# Patient Record
Sex: Male | Born: 1955 | Race: White | Hispanic: No | Smoking: Former smoker
Health system: Southern US, Community
[De-identification: ages and names within clinical notes are randomized; demographics above are authoritative.]

## PROBLEM LIST (undated history)

## (undated) DIAGNOSIS — Z9889 Other specified postprocedural states: Secondary | ICD-10-CM

## (undated) DIAGNOSIS — R112 Nausea with vomiting, unspecified: Secondary | ICD-10-CM

## (undated) DIAGNOSIS — M1990 Unspecified osteoarthritis, unspecified site: Secondary | ICD-10-CM

## (undated) HISTORY — PX: OTHER SURGICAL HISTORY: SHX169

## (undated) HISTORY — PX: UMBILICAL HERNIA REPAIR: SHX196

## (undated) HISTORY — PX: SHOULDER SURGERY: SHX246

---

## 1997-06-25 ENCOUNTER — Emergency Department (HOSPITAL_COMMUNITY): Admission: EM | Admit: 1997-06-25 | Discharge: 1997-06-25 | Payer: Self-pay | Admitting: Internal Medicine

## 1998-02-02 ENCOUNTER — Ambulatory Visit (HOSPITAL_BASED_OUTPATIENT_CLINIC_OR_DEPARTMENT_OTHER): Admission: RE | Admit: 1998-02-02 | Discharge: 1998-02-02 | Payer: Self-pay | Admitting: Surgery

## 2000-03-28 ENCOUNTER — Encounter: Admission: RE | Admit: 2000-03-28 | Discharge: 2000-04-09 | Payer: Self-pay | Admitting: General Surgery

## 2006-10-16 ENCOUNTER — Inpatient Hospital Stay (HOSPITAL_COMMUNITY): Admission: RE | Admit: 2006-10-16 | Discharge: 2006-10-18 | Payer: Self-pay | Admitting: Orthopedic Surgery

## 2007-05-04 ENCOUNTER — Emergency Department (HOSPITAL_COMMUNITY): Admission: EM | Admit: 2007-05-04 | Discharge: 2007-05-04 | Payer: Self-pay | Admitting: Emergency Medicine

## 2010-05-30 NOTE — Op Note (Signed)
NAME:  Andre Harvey, Andre Harvey           ACCOUNT NO.:  [id number]   MEDICAL RECORD NO.:  [id number]          PATIENT TYPE:  INP   LOCATION:  2550                         FACILITY:  [facility]   PHYSICIAN:  Dyke Brackett, M.D.    DATE OF BIRTH:  [birth year]   DATE OF PROCEDURE:  10/16/2006  DATE OF DISCHARGE:                               OPERATIVE REPORT   PREOPERATIVE DIAGNOSIS:  Osteoarthritis, left hip.   POSTOPERATIVE DIAGNOSIS:  Osteoarthritis, left hip.   OPERATION:  Left hip replacement (ASR porous-coated AML stem, 15 mm, +8-  mm neck length, with 58-mm acetabulum and 51-mm hip ball).   SURGEON:  Dyke Brackett, M.D.   General anesthesia.   ASSISTANT:  Oris Drone. Petrarca, P.A.-C.   BLOOD LOSS:  Approximately 300.   DESCRIPTION OF PROCEDURE:  Sterile prep and drape.  Lateral positioning.  Posterior approach to the hip made, splitting the iliotibial band, short  external rotators.  Sciatic nerve was carefully protected and the knee  kept flexed as much as possible throughout the procedure.  Short  external rotators were divided.  T capsulotomy was made in the hip.  Hip  was significantly degenerative.  Head was cut about one fingerbreadth  above the lesser trochanter, followed by progressive reaming up to a  14.5-mm diameter to accept a 15-mm stem.  Small statured broach was used  accordingly.  Once the femoral broaching was carried, out the broach was  removed.  Attention was next directed to the acetabulum.  Two wing  retractors were placed with careful protection of the soft tissues to  allow visualization with the cobra retractors additionally.  Acetabulum  was debrided relative to the labrum and soft tissue within it, then  progressively reamed up to 57-mm diameter to accept a 58-mg cup.  Good  bleeding bone was obtained.  The cup was finally inserted after trialing  at about 45 degrees of abduction and 20-30 degrees of anteversion.  The  mono-block ASR cup was used and this was  trialed again with the broach,  appropriate neck length thought to be +4 but once the prosthesis was  inserted, the prosthesis probably was to inserted to slightly more of a  depth, so to speak, in the canal than the trial.  For this reason a +8-  mm neck length was elected, which provided excellent stability, restored  leg lengths and had no tendency to dislocate in any position.  The  wounds were copiously irrigated and closed.  T capsulotomy was closed  with Ethibond, the fascia lata with Ethibond and the subcutaneous  tissues with 2-0 Vicryl, the skin with a skin clip, Marcaine with  epinephrine in the skin, a lightly compressive sterile dressing applied.  Taken to the recovery room in stable condition.     Dyke Brackett, M.D.  Electronically Signed    WDC/MEDQ  D:  10/16/2006  T:  10/16/2006  Job:  [id number]

## 2010-10-10 LAB — POCT URINE HEMOGLOBIN: Hgb urine dipstick: POSITIVE — AB

## 2010-10-26 LAB — CBC
HCT: 34.2 — ABNORMAL LOW
Hemoglobin: 11.4 — ABNORMAL LOW
Hemoglobin: 11.5 — ABNORMAL LOW
MCHC: 33.7
MCV: 83.8
Platelets: 328
RBC: 4.04 — ABNORMAL LOW
RBC: 4.07 — ABNORMAL LOW
RBC: 5.56
RDW: 13.8
RDW: 14.4 — ABNORMAL HIGH
WBC: 10.7 — ABNORMAL HIGH
WBC: 13.7 — ABNORMAL HIGH
WBC: 8.1

## 2010-10-26 LAB — PROTIME-INR
INR: 0.9
Prothrombin Time: 12.1

## 2010-10-26 LAB — COMPREHENSIVE METABOLIC PANEL
Alkaline Phosphatase: 62
BUN: 21
CO2: 24
Calcium: 9.7
Potassium: 4.4

## 2010-10-26 LAB — URINE CULTURE
Colony Count: NO GROWTH
Culture: NO GROWTH

## 2010-10-26 LAB — CROSSMATCH

## 2010-10-26 LAB — URINALYSIS, ROUTINE W REFLEX MICROSCOPIC
Glucose, UA: NEGATIVE
Nitrite: NEGATIVE
Protein, ur: NEGATIVE
Urobilinogen, UA: 0.2

## 2010-10-26 LAB — BASIC METABOLIC PANEL
BUN: 8
BUN: 9
Chloride: 103
Creatinine, Ser: 0.79
GFR calc Af Amer: >60
GFR calc non Af Amer: >60
GFR calc non Af Amer: >60
Potassium: 4.6

## 2010-10-26 LAB — DIFFERENTIAL
Basophils Absolute: 0.1
Basophils Relative: 1
Eosinophils Absolute: 0.3
Eosinophils Relative: 4
Lymphocytes Relative: 23
Lymphs Abs: 1.8
Monocytes Absolute: 0.7
Monocytes Relative: 9
Neutro Abs: 5.1

## 2010-10-26 LAB — ABO/RH: ABO/RH(D): A POS

## 2010-10-26 LAB — APTT: aPTT: 25

## 2012-10-21 ENCOUNTER — Other Ambulatory Visit: Payer: Self-pay | Admitting: Orthopedic Surgery

## 2012-10-21 DIAGNOSIS — M25552 Pain in left hip: Secondary | ICD-10-CM

## 2012-10-26 ENCOUNTER — Ambulatory Visit
Admission: RE | Admit: 2012-10-26 | Discharge: 2012-10-26 | Disposition: A | Discharge status: Home / Self Care | Payer: PRIVATE HEALTH INSURANCE | Source: Ambulatory Visit | Attending: Orthopedic Surgery | Admitting: Orthopedic Surgery

## 2012-10-26 DIAGNOSIS — M25552 Pain in left hip: Secondary | ICD-10-CM

## 2012-11-05 ENCOUNTER — Other Ambulatory Visit: Payer: Self-pay | Admitting: Sports Medicine

## 2012-11-05 DIAGNOSIS — M545 Low back pain: Secondary | ICD-10-CM

## 2012-11-12 ENCOUNTER — Other Ambulatory Visit: Payer: PRIVATE HEALTH INSURANCE

## 2012-11-17 ENCOUNTER — Other Ambulatory Visit: Payer: Self-pay | Admitting: Orthopedic Surgery

## 2012-11-26 ENCOUNTER — Encounter (HOSPITAL_COMMUNITY): Payer: Self-pay | Admitting: Pharmacy Technician

## 2012-11-28 ENCOUNTER — Encounter (HOSPITAL_COMMUNITY)
Admission: RE | Admit: 2012-11-28 | Discharge: 2012-11-28 | Disposition: A | Discharge status: Home / Self Care | Payer: PRIVATE HEALTH INSURANCE | Source: Ambulatory Visit | Attending: Orthopedic Surgery | Admitting: Orthopedic Surgery

## 2012-11-28 ENCOUNTER — Encounter (HOSPITAL_COMMUNITY): Payer: Self-pay

## 2012-11-28 ENCOUNTER — Ambulatory Visit (HOSPITAL_COMMUNITY)
Admission: RE | Admit: 2012-11-28 | Discharge: 2012-11-28 | Disposition: A | Discharge status: Home / Self Care | Payer: PRIVATE HEALTH INSURANCE | Source: Ambulatory Visit | Attending: Orthopedic Surgery | Admitting: Orthopedic Surgery

## 2012-11-28 DIAGNOSIS — M1610 Unilateral primary osteoarthritis, unspecified hip: Secondary | ICD-10-CM | POA: Insufficient documentation

## 2012-11-28 DIAGNOSIS — Z01818 Encounter for other preprocedural examination: Secondary | ICD-10-CM | POA: Insufficient documentation

## 2012-11-28 DIAGNOSIS — F17200 Nicotine dependence, unspecified, uncomplicated: Secondary | ICD-10-CM | POA: Insufficient documentation

## 2012-11-28 DIAGNOSIS — Z01810 Encounter for preprocedural cardiovascular examination: Secondary | ICD-10-CM | POA: Insufficient documentation

## 2012-11-28 DIAGNOSIS — I498 Other specified cardiac arrhythmias: Secondary | ICD-10-CM | POA: Insufficient documentation

## 2012-11-28 DIAGNOSIS — I4510 Unspecified right bundle-branch block: Secondary | ICD-10-CM | POA: Insufficient documentation

## 2012-11-28 DIAGNOSIS — M169 Osteoarthritis of hip, unspecified: Secondary | ICD-10-CM | POA: Insufficient documentation

## 2012-11-28 HISTORY — DX: Other specified postprocedural states: Z98.890

## 2012-11-28 HISTORY — DX: Unspecified osteoarthritis, unspecified site: M19.90

## 2012-11-28 HISTORY — DX: Nausea with vomiting, unspecified: R11.2

## 2012-11-28 LAB — COMPREHENSIVE METABOLIC PANEL
Alkaline Phosphatase: 88 U/L (ref 39–117)
CO2: 25 mEq/L (ref 19–32)
Calcium: 9.5 mg/dL (ref 8.4–10.5)
Creatinine, Ser: 0.78 mg/dL (ref 0.50–1.35)
GFR calc Af Amer: >90 mL/min (ref >90)
GFR calc non Af Amer: >90 mL/min (ref >90)
Glucose, Bld: 88 mg/dL (ref 70–99)
Sodium: 137 mEq/L (ref 135–145)

## 2012-11-28 LAB — URINALYSIS, ROUTINE W REFLEX MICROSCOPIC
Glucose, UA: NEGATIVE mg/dL
Hgb urine dipstick: NEGATIVE
Ketones, ur: NEGATIVE mg/dL
Nitrite: NEGATIVE
Protein, ur: NEGATIVE mg/dL

## 2012-11-28 LAB — CBC WITH DIFFERENTIAL/PLATELET
Basophils Absolute: 0 10*3/uL (ref 0.0–0.1)
Basophils Relative: 1 % (ref 0–1)
HCT: 43.4 % (ref 39.0–52.0)
MCHC: 32.7 g/dL (ref 30.0–36.0)
MCV: 82.4 fL (ref 78.0–100.0)
Monocytes Absolute: 0.8 10*3/uL (ref 0.1–1.0)
Neutro Abs: 4.5 10*3/uL (ref 1.7–7.7)
RDW: 14.3 % (ref 11.5–15.5)

## 2012-11-28 LAB — TYPE AND SCREEN: ABO/RH(D): A POS

## 2012-11-28 LAB — SURGICAL PCR SCREEN: Staphylococcus aureus: NEGATIVE

## 2012-11-28 LAB — APTT: aPTT: 28 seconds (ref 24–37)

## 2012-11-28 NOTE — Pre-Procedure Instructions (Signed)
Andre Harvey  11/28/2012   Your procedure is scheduled on:  November 24  Report to [facility] Entrance "A" [address] at 08:15 AM.  Call this number if you have problems the morning of surgery: [phone]   Remember:   Do not eat food or drink liquids after midnight.   Take these medicines the morning of surgery with A SIP OF WATER: Acyclovir, Oxycodone   STOP Voltaren Monday, November 17   STOP Aspirin, Aleve, Naproxen, Advil, Ibuprofen, Vitamin, Herbs, or Supplements starting today   Do not wear jewelry, make-up or nail polish.  Do not wear lotions, powders, or perfumes. You may wear deodorant.  Do not shave 48 hours prior to surgery. Men may shave face and neck.  Do not bring valuables to the hospital.  [facility] is not responsible                  for any belongings or valuables.               Contacts, dentures or bridgework may not be worn into surgery.  Leave suitcase in the car. After surgery it may be brought to your room.  For patients admitted to the hospital, discharge time is determined by your                treatment team.               Special Instructions: Shower using CHG 2 nights before surgery and the night before surgery.  If you shower the day of surgery use CHG.  Use special wash - you have one bottle of CHG for all showers.  You should use approximately 1/3 of the bottle for each shower.   Please read over the following fact sheets that you were given: Pain Booklet, Coughing and Deep Breathing, Blood Transfusion Information, Total Joint Packet and Surgical Site Infection Prevention

## 2012-12-01 NOTE — Progress Notes (Signed)
Anesthesia Chart Review:  Patient is a 57 year old male scheduled for left hip revision with removal of ball and cup on 12/08/12 by Dr. Turner Daniels.  History includes smoker, post-operative N/V, osteoarthritis, UHR, shoulder surgery, left THA '08. No PCP is specified.  Preoperative CXR and labs noted.  EKG on 11/28/12 showed SB @ 55 bpm, LAD, incomplete right BBB.  Currently, there are no comparison EKGs.  No CV symptoms were documented at his PAT appointment.  He has no documented history of HTN, CAD/MI, CHF, or DM.  He will be evaluated by his assigned anesthesiologist on the day of surgery, but if he remains asymptomatic from a CV standpoint then I would anticipate that he could proceed as planned.  Velna [facility]/Anesthesiology Phone [phone] 12/01/2012 3:54 PM

## 2012-12-05 NOTE — H&P (Signed)
TOTAL HIP REVISION ADMISSION H&P  Patient is admitted for left revision total hip arthroplasty.  Subjective:  Chief Complaint: left hip pain  HPI: Andre Harvey, 57 y.o. male, has a history of pain and functional disability in the left hip due to arthritis and patient has failed non-surgical conservative treatments for greater than 12 weeks to include NSAID's and/or analgesics, flexibility and strengthening excercises and activity modification. The indications for the revision total hip arthroplasty are bearing surface wear leading to  symptomatic synovitis.  Onset of symptoms was gradual starting 6 years ago with gradually worsening course since that time.  Prior procedures on the left hip include arthroplasty.  Patient currently rates pain in the left hip at 10 out of 10 with activity.  There is night pain, worsening of pain with activity and weight bearing, pain that interfers with activities of daily living and joint swelling.MRI: Patient's MRI does show a fluid collection over the lateral greater trochanter region at 1.6 x 1.9 x 1.6 cm.  Patient also has severe degenerative arthropathy of the right hip. This condition presents safety issues increasing the risk of falls.   There is no current active infection.  There are no active problems to display for this patient.  Past Medical History  Diagnosis Date  . PONV (postoperative nausea and vomiting)     x 1  . Osteoarthritis     Past Surgical History  Procedure Laterality Date  . Hip arthroplasty Left   . Shoulder surgery      left and right  . Umbilical hernia repair      No prescriptions prior to admission   Allergies  Allergen Reactions  . Codeine Nausea And Vomiting    History  Substance Use Topics  . Smoking status: Light Tobacco Smoker    Types: Cigars  . Smokeless tobacco: Not on file     Comment: reports box last 3- 4 months  . Alcohol Use: Yes     Comment: reports drinks about every 2- 3 months reports used to  be heavy    No family history on file.    Review of Systems  Constitutional: Negative.   HENT: Negative.   Eyes: Negative.   Respiratory: Negative.   Cardiovascular: Negative.   Gastrointestinal: Negative.   Genitourinary: Negative.   Musculoskeletal: Positive for joint pain.  Skin: Negative.   Neurological: Negative.   Endo/Heme/Allergies: Negative.   Psychiatric/Behavioral: Negative.     Objective:  Physical Exam  Constitutional: He is oriented to person, place, and time. He appears well-developed and well-nourished.  HENT:  Head: Normocephalic and atraumatic.  Eyes: Pupils are equal, round, and reactive to light.  Neck: Normal range of motion. Neck supple.  Cardiovascular: Intact distal pulses.   Respiratory: Effort normal.  Musculoskeletal:  Patient's skin is intact.  His respirations are regular and nonlabored.  Patient's left hip has mild pain with hip flexion extension internal/external rotation or log roll.  Patient's right hip also has pain with these motions.  He does have pain with internal and external rotation has difficulty with range of motion.    Neurological: He is alert and oriented to person, place, and time.  Skin: Skin is warm and dry.  Psychiatric: He has a normal mood and affect. His behavior is normal. Judgment and thought content normal.    Vital signs in last 24 hours:     Labs:   There is no height or weight on file to calculate BMI.  Imaging Review:  MRI: Patient's MRI does show a fluid collection over the lateral greater trochanter region at 1.6 x 1.9 x 1.6 cm.  Patient also has severe degenerative arthropathy of the right hip.  Assessment/Plan:  Assess: Status post left ASR hip in 2008 by Dr. Madelon Lips with fluid collection and pain  The patient history, physical examination, clinical judgement of the provider and imaging studies are consistent with end stage degenerative joint disease of the left hip(s), previous total hip  arthroplasty. Revision total hip arthroplasty is deemed medically necessary. The treatment options including medical management, injection therapy, arthroscopy and arthroplasty were discussed at length. The risks and benefits of total hip arthroplasty were presented and reviewed. The risks due to aseptic loosening, infection, stiffness, dislocation/subluxation,  thromboembolic complications and other imponderables were discussed.  The patient acknowledged the explanation, agreed to proceed with the plan and consent was signed. Patient is being admitted for inpatient treatment for surgery, pain control, PT, OT, prophylactic antibiotics, VTE prophylaxis, progressive ambulation and ADL's and discharge planning. The patient is planning to be discharged home with home health services

## 2012-12-07 MED ORDER — CEFAZOLIN SODIUM-DEXTROSE 2-3 GM-% IV SOLR
2.0000 g | INTRAVENOUS | Status: AC
  Administered 2012-12-08: 2 g via INTRAVENOUS

## 2012-12-08 ENCOUNTER — Encounter (HOSPITAL_COMMUNITY): Payer: Worker's Compensation | Admitting: Vascular Surgery

## 2012-12-08 ENCOUNTER — Inpatient Hospital Stay (HOSPITAL_COMMUNITY): Payer: Worker's Compensation | Admitting: Certified Registered"

## 2012-12-08 ENCOUNTER — Inpatient Hospital Stay (HOSPITAL_COMMUNITY): Payer: Worker's Compensation

## 2012-12-08 ENCOUNTER — Encounter (HOSPITAL_COMMUNITY): Payer: Self-pay | Admitting: *Deleted

## 2012-12-08 ENCOUNTER — Encounter (HOSPITAL_COMMUNITY)
Admission: RE | Disposition: A | Discharge status: Home Health | Payer: Self-pay | Source: Ambulatory Visit | Attending: Orthopedic Surgery

## 2012-12-08 ENCOUNTER — Inpatient Hospital Stay (HOSPITAL_COMMUNITY)
Admission: RE | Admit: 2012-12-08 | Discharge: 2012-12-10 | DRG: 468 | Disposition: A | Discharge status: Home Health | Payer: Worker's Compensation | Source: Ambulatory Visit | Attending: Orthopedic Surgery | Admitting: Orthopedic Surgery

## 2012-12-08 DIAGNOSIS — F17200 Nicotine dependence, unspecified, uncomplicated: Secondary | ICD-10-CM | POA: Diagnosis present

## 2012-12-08 DIAGNOSIS — M6580 Other synovitis and tenosynovitis, unspecified site: Secondary | ICD-10-CM | POA: Diagnosis present

## 2012-12-08 DIAGNOSIS — T8489XA Other specified complication of internal orthopedic prosthetic devices, implants and grafts, initial encounter: Principal | ICD-10-CM | POA: Diagnosis present

## 2012-12-08 DIAGNOSIS — T8484XA Pain due to internal orthopedic prosthetic devices, implants and grafts, initial encounter: Secondary | ICD-10-CM

## 2012-12-08 DIAGNOSIS — Z96649 Presence of unspecified artificial hip joint: Secondary | ICD-10-CM

## 2012-12-08 HISTORY — PX: TOTAL HIP REVISION: SHX763

## 2012-12-08 SURGERY — TOTAL HIP REVISION
Anesthesia: General | Site: Hip | Laterality: Left | Wound class: Clean

## 2012-12-08 MED ORDER — METHOCARBAMOL 500 MG PO TABS: 500.0000 mg | ORAL_TABLET | Freq: Two times a day (BID) | ORAL | Status: DC

## 2012-12-08 MED ORDER — DEXAMETHASONE SODIUM PHOSPHATE 4 MG/ML IJ SOLN
INTRAMUSCULAR | Status: DC | PRN
  Administered 2012-12-08: 4 mg via INTRAVENOUS

## 2012-12-08 MED ORDER — ONDANSETRON HCL 4 MG/2ML IJ SOLN
INTRAMUSCULAR | Status: DC | PRN
  Administered 2012-12-08: 4 mg via INTRAVENOUS

## 2012-12-08 MED ORDER — FENTANYL CITRATE 0.05 MG/ML IJ SOLN
INTRAMUSCULAR | Status: DC | PRN
  Administered 2012-12-08: 100 ug via INTRAVENOUS
  Administered 2012-12-08 (×3): 50 ug via INTRAVENOUS

## 2012-12-08 MED ORDER — MAGNESIUM CITRATE PO SOLN: 1.0000 | Freq: Once | ORAL | Status: AC | PRN

## 2012-12-08 MED ORDER — HYDROMORPHONE HCL PF 1 MG/ML IJ SOLN
0.2500 mg | INTRAMUSCULAR | Status: DC | PRN
  Administered 2012-12-08 (×2): 0.5 mg via INTRAVENOUS

## 2012-12-08 MED ORDER — METHOCARBAMOL 500 MG PO TABS
ORAL_TABLET | ORAL | Status: AC
  Filled 2012-12-08: qty 1

## 2012-12-08 MED ORDER — KCL IN DEXTROSE-NACL 20-5-0.45 MEQ/L-%-% IV SOLN
INTRAVENOUS | Status: DC
  Administered 2012-12-09: 09:00:00 via INTRAVENOUS
  Filled 2012-12-08 (×7): qty 1000

## 2012-12-08 MED ORDER — METOCLOPRAMIDE HCL 5 MG/ML IJ SOLN: 10.0000 mg | Freq: Once | INTRAMUSCULAR | Status: DC | PRN

## 2012-12-08 MED ORDER — NEOSTIGMINE METHYLSULFATE 1 MG/ML IJ SOLN
INTRAMUSCULAR | Status: DC | PRN
  Administered 2012-12-08: 4 mg via INTRAVENOUS

## 2012-12-08 MED ORDER — SENNOSIDES-DOCUSATE SODIUM 8.6-50 MG PO TABS: 1.0000 | ORAL_TABLET | Freq: Every evening | ORAL | Status: DC | PRN

## 2012-12-08 MED ORDER — ACETAMINOPHEN 325 MG PO TABS: 650.0000 mg | ORAL_TABLET | Freq: Four times a day (QID) | ORAL | Status: DC | PRN

## 2012-12-08 MED ORDER — DIPHENHYDRAMINE HCL 12.5 MG/5ML PO ELIX: 12.5000 mg | ORAL_SOLUTION | ORAL | Status: DC | PRN

## 2012-12-08 MED ORDER — GLYCOPYRROLATE 0.2 MG/ML IJ SOLN
INTRAMUSCULAR | Status: DC | PRN
  Administered 2012-12-08: 0.2 mg via INTRAVENOUS
  Administered 2012-12-08: .6 mg via INTRAVENOUS

## 2012-12-08 MED ORDER — OXYCODONE HCL 5 MG PO TABS
5.0000 mg | ORAL_TABLET | Freq: Once | ORAL | Status: AC | PRN
  Administered 2012-12-08: 5 mg via ORAL

## 2012-12-08 MED ORDER — PHENOL 1.4 % MT LIQD: 1.0000 | OROMUCOSAL | Status: DC | PRN

## 2012-12-08 MED ORDER — LACTATED RINGERS IV SOLN
INTRAVENOUS | Status: DC
  Administered 2012-12-08 (×2): via INTRAVENOUS

## 2012-12-08 MED ORDER — MIDAZOLAM HCL 5 MG/5ML IJ SOLN
INTRAMUSCULAR | Status: DC | PRN
  Administered 2012-12-08: 2 mg via INTRAVENOUS

## 2012-12-08 MED ORDER — METOCLOPRAMIDE HCL 5 MG/ML IJ SOLN
INTRAMUSCULAR | Status: DC | PRN
  Administered 2012-12-08: 10 mg via INTRAVENOUS

## 2012-12-08 MED ORDER — VECURONIUM BROMIDE 10 MG IV SOLR
INTRAVENOUS | Status: DC | PRN
  Administered 2012-12-08: 2 mg via INTRAVENOUS
  Administered 2012-12-08: 3 mg via INTRAVENOUS
  Administered 2012-12-08: 2 mg via INTRAVENOUS

## 2012-12-08 MED ORDER — HYDROMORPHONE HCL PF 1 MG/ML IJ SOLN
INTRAMUSCULAR | Status: AC
  Administered 2012-12-08: 0.5 mg via INTRAVENOUS
  Filled 2012-12-08: qty 1

## 2012-12-08 MED ORDER — METOCLOPRAMIDE HCL 5 MG/ML IJ SOLN: 5.0000 mg | Freq: Three times a day (TID) | INTRAMUSCULAR | Status: DC | PRN

## 2012-12-08 MED ORDER — OXYCODONE HCL 5 MG/5ML PO SOLN
5.0000 mg | Freq: Once | ORAL | Status: AC | PRN
Start: 2012-12-08 — End: 2012-12-08

## 2012-12-08 MED ORDER — HYDROMORPHONE HCL PF 1 MG/ML IJ SOLN
0.2500 mg | INTRAMUSCULAR | Status: DC | PRN
  Administered 2012-12-08 (×5): 0.5 mg via INTRAVENOUS

## 2012-12-08 MED ORDER — ONDANSETRON HCL 4 MG/2ML IJ SOLN: 4.0000 mg | Freq: Four times a day (QID) | INTRAMUSCULAR | Status: DC | PRN

## 2012-12-08 MED ORDER — SODIUM CHLORIDE 0.9 % IR SOLN
Status: DC | PRN
  Administered 2012-12-08: 1000 mL

## 2012-12-08 MED ORDER — MENTHOL 3 MG MT LOZG: 1.0000 | LOZENGE | OROMUCOSAL | Status: DC | PRN

## 2012-12-08 MED ORDER — ASPIRIN EC 325 MG PO TBEC
325.0000 mg | DELAYED_RELEASE_TABLET | Freq: Every day | ORAL | Status: DC
  Administered 2012-12-09: 325 mg via ORAL
  Filled 2012-12-08 (×3): qty 1

## 2012-12-08 MED ORDER — OXYCODONE-ACETAMINOPHEN 10-325 MG PO TABS: 1.0000 | ORAL_TABLET | Freq: Four times a day (QID) | ORAL | Status: DC | PRN

## 2012-12-08 MED ORDER — CEFAZOLIN SODIUM-DEXTROSE 2-3 GM-% IV SOLR
INTRAVENOUS | Status: AC
  Filled 2012-12-08: qty 50

## 2012-12-08 MED ORDER — ARTIFICIAL TEARS OP OINT
TOPICAL_OINTMENT | OPHTHALMIC | Status: DC | PRN
  Administered 2012-12-08: 1 via OPHTHALMIC

## 2012-12-08 MED ORDER — OXYCODONE HCL 5 MG PO TABS
ORAL_TABLET | ORAL | Status: AC
  Filled 2012-12-08: qty 2

## 2012-12-08 MED ORDER — LIDOCAINE HCL (CARDIAC) 20 MG/ML IV SOLN
INTRAVENOUS | Status: DC | PRN
  Administered 2012-12-08: 100 mg via INTRAVENOUS

## 2012-12-08 MED ORDER — PROPOFOL 10 MG/ML IV BOLUS
INTRAVENOUS | Status: DC | PRN
  Administered 2012-12-08: 200 mg via INTRAVENOUS

## 2012-12-08 MED ORDER — ASPIRIN EC 325 MG PO TBEC: 325.0000 mg | DELAYED_RELEASE_TABLET | Freq: Two times a day (BID) | ORAL | Status: DC

## 2012-12-08 MED ORDER — METHOCARBAMOL 100 MG/ML IJ SOLN
500.0000 mg | Freq: Four times a day (QID) | INTRAVENOUS | Status: DC | PRN
  Filled 2012-12-08: qty 5

## 2012-12-08 MED ORDER — ROCURONIUM BROMIDE 100 MG/10ML IV SOLN
INTRAVENOUS | Status: DC | PRN
  Administered 2012-12-08: 50 mg via INTRAVENOUS

## 2012-12-08 MED ORDER — HYDROMORPHONE HCL PF 1 MG/ML IJ SOLN
INTRAMUSCULAR | Status: AC
  Filled 2012-12-08: qty 1

## 2012-12-08 MED ORDER — ONDANSETRON HCL 4 MG PO TABS: 4.0000 mg | ORAL_TABLET | Freq: Four times a day (QID) | ORAL | Status: DC | PRN

## 2012-12-08 MED ORDER — KCL IN DEXTROSE-NACL 20-5-0.45 MEQ/L-%-% IV SOLN
INTRAVENOUS | Status: AC
  Filled 2012-12-08: qty 1000

## 2012-12-08 MED ORDER — METOCLOPRAMIDE HCL 10 MG PO TABS
5.0000 mg | ORAL_TABLET | Freq: Three times a day (TID) | ORAL | Status: DC | PRN
  Administered 2012-12-10: 10 mg via ORAL
  Filled 2012-12-08: qty 1

## 2012-12-08 MED ORDER — ACETAMINOPHEN 650 MG RE SUPP: 650.0000 mg | Freq: Four times a day (QID) | RECTAL | Status: DC | PRN

## 2012-12-08 MED ORDER — HYDROMORPHONE HCL PF 1 MG/ML IJ SOLN
INTRAMUSCULAR | Status: AC
  Filled 2012-12-08: qty 2

## 2012-12-08 MED ORDER — METHOCARBAMOL 500 MG PO TABS
500.0000 mg | ORAL_TABLET | Freq: Four times a day (QID) | ORAL | Status: DC | PRN
  Administered 2012-12-08 – 2012-12-10 (×6): 500 mg via ORAL
  Filled 2012-12-08 (×6): qty 1

## 2012-12-08 MED ORDER — OXYCODONE HCL 5 MG PO TABS
5.0000 mg | ORAL_TABLET | ORAL | Status: DC | PRN
  Administered 2012-12-08 – 2012-12-10 (×10): 10 mg via ORAL
  Filled 2012-12-08 (×8): qty 2

## 2012-12-08 MED ORDER — HYDROMORPHONE HCL PF 1 MG/ML IJ SOLN
1.0000 mg | INTRAMUSCULAR | Status: DC | PRN
  Administered 2012-12-09: 1 mg via INTRAVENOUS
  Filled 2012-12-08: qty 1

## 2012-12-08 MED ORDER — DOCUSATE SODIUM 100 MG PO CAPS
100.0000 mg | ORAL_CAPSULE | Freq: Two times a day (BID) | ORAL | Status: DC
  Administered 2012-12-08 – 2012-12-09 (×3): 100 mg via ORAL
  Filled 2012-12-08 (×5): qty 1

## 2012-12-08 MED ORDER — BISACODYL 5 MG PO TBEC: 5.0000 mg | DELAYED_RELEASE_TABLET | Freq: Every day | ORAL | Status: DC | PRN

## 2012-12-08 MED ORDER — OXYCODONE HCL 5 MG PO TABS
ORAL_TABLET | ORAL | Status: AC
  Filled 2012-12-08: qty 3

## 2012-12-08 MED ORDER — BUPIVACAINE-EPINEPHRINE 0.5% -1:200000 IJ SOLN
INTRAMUSCULAR | Status: DC | PRN
  Administered 2012-12-08: 20 mL

## 2012-12-08 SURGICAL SUPPLY — 68 items
BOWL SMART MIX CTS (DISPOSABLE) IMPLANT
BRUSH FEMORAL CANAL (MISCELLANEOUS) IMPLANT
CLOTH BEACON ORANGE TIMEOUT ST (SAFETY) ×2 IMPLANT
COVER SURGICAL LIGHT HANDLE (MISCELLANEOUS) ×2 IMPLANT
CUP ACET PINNACLE SECTR 60MM (Hips) IMPLANT
DRAPE C-ARM 42X72 X-RAY (DRAPES) IMPLANT
DRAPE ORTHO SPLIT 77X108 STRL (DRAPES) ×2
DRAPE PROXIMA HALF (DRAPES) ×2 IMPLANT
DRAPE SURG ORHT 6 SPLT 77X108 (DRAPES) ×1 IMPLANT
DRAPE U-SHAPE 47X51 STRL (DRAPES) ×2 IMPLANT
DRILL BIT 7/64X5 (BIT) ×2 IMPLANT
DRSG AQUACEL AG ADV 3.5X10 (GAUZE/BANDAGES/DRESSINGS) ×1 IMPLANT
DRSG MEPILEX BORDER 4X8 (GAUZE/BANDAGES/DRESSINGS) ×2 IMPLANT
DURAPREP 26ML APPLICATOR (WOUND CARE) ×2 IMPLANT
ELECT BLADE 4.0 EZ CLEAN MEGAD (MISCELLANEOUS)
ELECT BLADE 6.5 EXT (BLADE) IMPLANT
ELECT REM PT RETURN 9FT ADLT (ELECTROSURGICAL) ×2
ELECTRODE BLDE 4.0 EZ CLN MEGD (MISCELLANEOUS) IMPLANT
ELECTRODE REM PT RTRN 9FT ADLT (ELECTROSURGICAL) ×1 IMPLANT
EVACUATOR 1/8 PVC DRAIN (DRAIN) IMPLANT
GAUZE XEROFORM 5X9 LF (GAUZE/BANDAGES/DRESSINGS) ×2 IMPLANT
GLOVE BIO SURGEON STRL SZ7.5 (GLOVE) ×2 IMPLANT
GLOVE BIO SURGEON STRL SZ8.5 (GLOVE) ×4 IMPLANT
GLOVE BIOGEL PI IND STRL 8 (GLOVE) ×2 IMPLANT
GLOVE BIOGEL PI IND STRL 9 (GLOVE) ×1 IMPLANT
GLOVE BIOGEL PI INDICATOR 8 (GLOVE) ×2
GLOVE BIOGEL PI INDICATOR 9 (GLOVE) ×1
GOWN PREVENTION PLUS XLARGE (GOWN DISPOSABLE) ×6 IMPLANT
GOWN STRL NON-REIN LRG LVL3 (GOWN DISPOSABLE) ×4 IMPLANT
GOWN STRL REIN XL XLG (GOWN DISPOSABLE) ×4 IMPLANT
HANDPIECE INTERPULSE COAX TIP (DISPOSABLE)
HEAD CER BIO DELTA 36 PLUS 1.5 (Hips) ×1 IMPLANT
HEEL PROTECTOR  874200 (MISCELLANEOUS)
HEEL PROTECTOR 874200 (MISCELLANEOUS) IMPLANT
HOOD PEEL AWAY FACE SHEILD DIS (HOOD) ×4 IMPLANT
KIT BASIN OR (CUSTOM PROCEDURE TRAY) ×2 IMPLANT
KIT ROOM TURNOVER OR (KITS) ×2 IMPLANT
LINER MARATHON 10D 36M 58+10 (Hips) IMPLANT
LINER MARATHON 10DEG 36M 58+10 (Hips) ×2 IMPLANT
MANIFOLD NEPTUNE II (INSTRUMENTS) ×2 IMPLANT
NEEDLE 22X1 1/2 (OR ONLY) (NEEDLE) ×2 IMPLANT
NOZZLE PRISM 8.5MM (MISCELLANEOUS) IMPLANT
NS IRRIG 1000ML POUR BTL (IV SOLUTION) ×4 IMPLANT
PACK TOTAL JOINT (CUSTOM PROCEDURE TRAY) ×2 IMPLANT
PAD ARMBOARD 7.5X6 YLW CONV (MISCELLANEOUS) ×4 IMPLANT
PASSER SUT SWANSON 36MM LOOP (INSTRUMENTS) IMPLANT
PINNSECTOR W/GRIP ACE CUP 60MM (Hips) ×2 IMPLANT
PRESSURIZER FEMORAL UNIV (MISCELLANEOUS) IMPLANT
SCREW PINN CAN BONE 6.5X50MM (Screw) ×1 IMPLANT
SET HNDPC FAN SPRY TIP SCT (DISPOSABLE) IMPLANT
SLEEVE SURGEON STRL (DRAPES) IMPLANT
SPONGE GAUZE 4X4 12PLY (GAUZE/BANDAGES/DRESSINGS) ×2 IMPLANT
SPONGE LAP 18X18 X RAY DECT (DISPOSABLE) IMPLANT
STAPLER VISISTAT 35W (STAPLE) ×2 IMPLANT
SUT ETHIBOND 2 V 37 (SUTURE) ×2 IMPLANT
SUT ETHILON 3 0 FSL (SUTURE) ×2 IMPLANT
SUT VIC AB 0 CTB1 27 (SUTURE) ×2 IMPLANT
SUT VIC AB 1 CTX 36 (SUTURE) ×2
SUT VIC AB 1 CTX36XBRD ANBCTR (SUTURE) ×1 IMPLANT
SUT VIC AB 2-0 CTB1 (SUTURE) ×2 IMPLANT
SYR 20ML ECCENTRIC (SYRINGE) ×2 IMPLANT
SYR CONTROL 10ML LL (SYRINGE) ×2 IMPLANT
TOWEL OR 17X24 6PK STRL BLUE (TOWEL DISPOSABLE) ×2 IMPLANT
TOWEL OR 17X26 10 PK STRL BLUE (TOWEL DISPOSABLE) ×2 IMPLANT
TOWER CARTRIDGE SMART MIX (DISPOSABLE) IMPLANT
TRAY FOLEY CATH 14FR (SET/KITS/TRAYS/PACK) IMPLANT
TUBE ANAEROBIC SPECIMEN COL (MISCELLANEOUS) ×2 IMPLANT
WATER STERILE IRR 1000ML POUR (IV SOLUTION) ×6 IMPLANT

## 2012-12-08 NOTE — Interval H&P Note (Signed)
History and Physical Interval Note:  12/08/2012 9:05 AM  Andre Harvey  has presented today for surgery, with the diagnosis of LEFT HIP PAIN WITH PAINFUL RETAINED ASR HARDWARE  The various methods of treatment have been discussed with the patient and family. After consideration of risks, benefits and other options for treatment, the patient has consented to  Procedure(s): TOTAL HIP REVISION WITH HARDWARE REMOVAL ( BALL& CUP) (Left) as a surgical intervention .  The patient's history has been reviewed, patient examined, no change in status, stable for surgery.  I have reviewed the patient's chart and labs.  Questions were answered to the patient's satisfaction.     [name]

## 2012-12-08 NOTE — Anesthesia Preprocedure Evaluation (Addendum)
Anesthesia Evaluation  Patient identified by MRN, date of birth, ID band Patient awake    Reviewed: Allergy & Precautions, H&P , NPO status , Patient's Chart, lab work & pertinent test results, reviewed documented beta blocker date and time   History of Anesthesia Complications (+) PONV and history of anesthetic complications  Airway Mallampati: II TM Distance: >3 FB Neck ROM: full    Dental  (+) Teeth Intact and Dental Advisory Given   Pulmonary Current Smoker,  breath sounds clear to auscultation        Cardiovascular negative cardio ROS  Rhythm:regular Rate:Normal     Neuro/Psych negative neurological ROS  negative psych ROS   GI/Hepatic negative GI ROS, Neg liver ROS,   Endo/Other  negative endocrine ROS  Renal/GU negative Renal ROS  negative genitourinary   Musculoskeletal   Abdominal   Peds  Hematology negative hematology ROS (+)   Anesthesia Other Findings See surgeon's H&P   Reproductive/Obstetrics negative OB ROS                          Anesthesia Physical Anesthesia Plan  ASA: II  Anesthesia Plan: General   Post-op Pain Management:    Induction: Intravenous  Airway Management Planned: Oral ETT  Additional Equipment:   Intra-op Plan:   Post-operative Plan: Extubation in OR  Informed Consent: I have reviewed the patients History and Physical, chart, labs and discussed the procedure including the risks, benefits and alternatives for the proposed anesthesia with the patient or authorized representative who has indicated his/her understanding and acceptance.   Dental Advisory Given  Plan Discussed with: CRNA and Surgeon  Anesthesia Plan Comments:         Anesthesia Quick Evaluation

## 2012-12-08 NOTE — Anesthesia Postprocedure Evaluation (Signed)
Anesthesia Post Note  Patient: Andre Harvey  Procedure(s) Performed: Procedure(s) (LRB): TOTAL HIP REVISION WITH HARDWARE REMOVAL ( BALL& CUP) (Left)  Anesthesia type: General  Patient location: PACU  Post pain: Pain level controlled  Post assessment: Patient's Cardiovascular Status Stable  Last Vitals:  Filed Vitals:   12/08/12 1315  BP: 157/76  Pulse: 57  Temp:   Resp: 13    Post vital signs: Reviewed and stable  Level of consciousness: alert  Complications: No apparent anesthesia complications

## 2012-12-08 NOTE — Transfer of Care (Signed)
Immediate Anesthesia Transfer of Care Note  Patient: Andre Harvey  Procedure(s) Performed: Procedure(s): TOTAL HIP REVISION WITH HARDWARE REMOVAL ( BALL& CUP) (Left)  Patient Location: PACU  Anesthesia Type:General  Level of Consciousness: awake, alert  and oriented  Airway & Oxygen Therapy: Patient Spontanous Breathing and Patient connected to nasal cannula oxygen  Post-op Assessment: Report given to PACU RN and Patient moving all extremities X 4  Post vital signs: Reviewed and stable  Complications: No apparent anesthesia complications

## 2012-12-08 NOTE — Op Note (Addendum)
Preop diagnosis: Painful Left Depuy ASR on AML total hip  Postoperative diagnosis: Same  Procedure: Revision left total hip arthroplasty with removal of ASR cup and femoral head and revision to a 60 mm Gryption cup 10 polyethylene liner index posterior and a +1.5 36 mm ceramic head.  Surgeon: Feliberto Gottron. Turner Daniels M.D.  Assistant: Tomi Likens. Gaylene Brooks  (present throughout entire procedure and necessary for timely completion of the procedure)  Estimated blood loss: 400 cc  Fluid replacement: 1800 cc of crystalloid  Complications: None  Indications: Patient with a left ASR on S-ROM total hip that did very well until a few months ago when he had increasing groin pain. The pain wakes him up at night and recently got to the point where he could no longer go walking on a regular basis. MRI scan showed fluid collection, but no bony destruction. Plain x-rays show no change in the position of the components and the stem appears to be well ingrown. Risks and benefits of revision surgery have been discussed and questions answered.  Procedure: Patient was identified by arm band receive preoperative IV antibiotics in the holding area at, and hospital. He was then taken to the operating room where the appropriate anesthetic monitors were attached and general endotracheal anesthesia induced with the patient in the supine position. He was then rolled into the R lateral decubitus position and fixed there with a mark 2 pelvic clamp. A Foley catheter was inserted and the limb prepped and draped in usual sterile fashion from the ankle to the hemipelvis. Time out procedure performed. Skin along the lateral hip and thigh infiltrated with 10 cc of 1/2% Marcaine and epinephrine solution. We began the operation by recreating the old posterior lateral incision 15 cm in line through the skin and subcutaneous tissue down to the level of the IT band which was cut in line with the skin incision. The knee the IT band we encountered  clear fluid with a reactive sac that was excised.. We then remove scar tissue from around to the ASR cup and femoral stem trunnion, dislocated a total hip and removed the ASR head with a mallet and metal cylinder. The trunnion was then tucked anterior and superior to the acetabulum and we continued to remove scar tissue from around the acetabular component. A posterior inferior wing retractor was hammered into place. And we began loosening the cup by placing a 1/4 inch osteotome between the edge of the acetabular component and the bone. We then used the short Innomed curved osteotome around the edge of the cup and at that point it came out relatively easily indicating no and growth. Very little bone was noticed on the ingrowth surface of the cup and fibrous tissue is then stripped from the acetabulum. We reamed up to a 59 mm basket reamer obtaining good coverage in all quadrants irrigated with normal saline solution. We then hammered into place a 60 mm Gryption cup in 45 of abduction and 25 of anteversion. A central occluder was screwed into place followed by a 10 polyethylene liner index posterior and superior. A trial reduction was then performed with a +0 36 mm femoral head instability was noted to 90 of flexion 70 of internal rotation and in full extension the hip could not be dislocated with external rotation. At this point a real +1.5 36 mm ceramic head was hammered into place, the hip reduced and irrigated with normal saline solution. The capsular flap was repaired back to the intertrochanteric crest through  drill holes with #2 Ethibond suture. We then closed the IT band with running #1 Vicryl suture, the subcutaneous tissue with 0 and 2-0 undyed Vicryl suture, the skin was closed with running interlocking 3-0 nylon suture. A dressing of Mepilex was then applied, the patient was unclamped a rolled supine awakened extubated and taken to the recovery without difficulty.

## 2012-12-08 NOTE — Preoperative (Signed)
Beta Blockers   Reason not to administer Beta Blockers:Not Applicable 

## 2012-12-09 ENCOUNTER — Encounter (HOSPITAL_COMMUNITY): Payer: Self-pay | Admitting: General Practice

## 2012-12-09 LAB — BASIC METABOLIC PANEL
CO2: 26 mEq/L (ref 19–32)
Calcium: 8.9 mg/dL (ref 8.4–10.5)
Chloride: 99 mEq/L (ref 96–112)
GFR calc Af Amer: >90 mL/min (ref >90)
Glucose, Bld: 108 mg/dL — ABNORMAL HIGH (ref 70–99)
Potassium: 3.7 mEq/L (ref 3.5–5.1)
Sodium: 137 mEq/L (ref 135–145)

## 2012-12-09 LAB — CBC
Hemoglobin: 12 g/dL — ABNORMAL LOW (ref 13.0–17.0)
MCV: 81.6 fL (ref 78.0–100.0)
Platelets: 295 10*3/uL (ref 150–400)
RBC: 4.3 MIL/uL (ref 4.22–5.81)
RDW: 14.4 % (ref 11.5–15.5)
WBC: 11.1 10*3/uL — ABNORMAL HIGH (ref 4.0–10.5)

## 2012-12-09 NOTE — Progress Notes (Signed)
  PT Progress Note   12/09/12 1539  PT Visit Information  Last PT Received On 12/09/12  Assistance Needed +2  History of Present Illness Revision right total hip arthroplasty with removal of ASR cup and femoral head and revision   PT Time Calculation  PT Start Time 1435  PT Stop Time 1507  PT Time Calculation (min) 32 min  Subjective Data  Patient Stated Goal To return home  Precautions  Precautions Posterior Hip;Fall  Precaution Booklet Issued Yes (comment)  Precaution Comments Pt able to recall 1/3 post hip precautions  Restrictions  Weight Bearing Restrictions Yes  LLE Weight Bearing WBAT  Cognition  Arousal/Alertness Awake/alert  Behavior During Therapy WFL for tasks assessed/performed  Overall Cognitive Status Within Functional Limits for tasks assessed  Bed Mobility  Bed Mobility Not assessed  Details for Bed Mobility Assistance Pt received up in recliner  Transfers  Transfers Sit to Stand;Stand to Sit  Sit to Stand 1: +2 Total assist;With upper extremity assist;From chair/3-in-1  Sit to Stand: Patient Percentage 50%  Stand to Sit 1: +2 Total assist;With upper extremity assist;With armrests;To chair/3-in-1  Stand to Sit: Patient Percentage 50%  Details for Transfer Assistance VC's for hand placement on seated surface prior to initiating transfers.  Ambulation/Gait  Ambulation/Gait Assistance 4: Min guard  Ambulation Distance (Feet) 65 Feet  Assistive device Rolling walker  Ambulation/Gait Assistance Details VC's for sequencing and safety awareness with the RW. Pt was cued for increased heel strike, and encouraged step-through gait pattern  Gait Pattern Step-to pattern;Step-through pattern;Decreased stride length;Left flexed knee in stance  Gait velocity Decreased  Stairs No  Exercises  Exercises General Lower Extremity  General Exercises - Lower Extremity  Ankle Circles/Pumps 10 reps  Quad Sets 10 reps  Short Arc Quad 10 reps  Heel Slides 10 reps  Hip  ABduction/ADduction 10 reps  PT - End of Session  Equipment Utilized During Treatment Gait belt  Activity Tolerance Patient tolerated treatment well  Patient left in chair;with call bell/phone within reach  Nurse Communication Mobility status  PT - Assessment/Plan  PT Plan Current plan remains appropriate  PT Frequency 7X/week  Follow Up Recommendations Home health PT  PT equipment Rolling walker with 5" wheels;3in1 (PT)  PT Goal Progression  Progress towards PT goals Progressing toward goals  Acute Rehab PT Goals  PT Goal Formulation With patient  Time For Goal Achievement 12/16/12  Potential to Achieve Goals Good  PT General Charges  $$ ACUTE PT VISIT 1 Procedure  PT Treatments  $Gait Training 8-22 mins  $Therapeutic Exercise 8-22 mins    Assessment: Pt is progressing towards physical therapy goals and was able to ambulate much farther this session. He was able to advance LLE without assist during gait training, and tolerated therapeutic exercise.  Vitals/Pain: Pt reports 6/10 pain at end of session.  Ruthann Cancer, PT, DPT [phone]

## 2012-12-09 NOTE — Progress Notes (Signed)
Pt repts that he feels as if he "can't empty" his bladder. Pt has voided 1500 cc on day shift alone. Pt voided 500cc and bladder scanned for 200 cc. IV changed to NSL at 1305. Pt encouraged to stand to void to see if that helps him empty. Will continue to monitor.

## 2012-12-09 NOTE — Evaluation (Signed)
Physical Therapy Evaluation Patient Details Name: Andre Harvey MRN: [id number] DOB: 1955-12-26 Today's Date: 12/09/2012 Time: 1202-1231 PT Time Calculation (min): 29 min  PT Assessment / Plan / Recommendation History of Present Illness  Revision right total hip arthroplasty with removal of ASR cup and femoral head and revision   Clinical Impression  This patient presents with acute pain and decreased functional independence following the above mentioned procedure. At the time of PT eval, pt with increased difficulty transitioning to EOB or moving LLE due to pain. Assist was required to advance LLE during SPT to recliner. This patient is appropriate for skilled PT interventions to address functional limitations, improve safety and independence with functional mobility, and return to PLOF.     PT Assessment  Patient needs continued PT services    Follow Up Recommendations  Home health PT    Does the patient have the potential to tolerate intense rehabilitation      Barriers to Discharge Decreased caregiver support Pt is unable to say for sure how much help he will have at home, as pt's girlfriend does not live with him and works inconsistent hours    Engineer, agricultural with 5" wheels;3in1 (PT)    Recommendations for Other Services     Frequency 7X/week    Precautions / Restrictions Precautions Precautions: Posterior Hip;Fall Precaution Booklet Issued: Yes (comment) Precaution Comments: Pt able to remember only one precaution at end of session (no crossing legs) Restrictions Weight Bearing Restrictions: Yes LLE Weight Bearing: Weight bearing as tolerated   Pertinent Vitals/Pain Nursing called for pain medication prior to end of session.       Mobility  Bed Mobility Bed Mobility: Supine to Sit;Sitting - Scoot to Edge of Bed Supine to Sit: 1: +2 Total assist;HOB elevated;With rails Supine to Sit: Patient Percentage: 20% Sitting - Scoot to Edge  of Bed: 4: Min assist Details for Bed Mobility Assistance: Assist for support and movement of LLE to EOB. Transfers Transfers: Sit to Stand;Stand to Sit;Stand Pivot Transfers Sit to Stand: 1: +2 Total assist;With upper extremity assist;From bed Sit to Stand: Patient Percentage: 50% Stand to Sit: 1: +2 Total assist;With upper extremity assist;With armrests;To chair/3-in-1 Stand to Sit: Patient Percentage: 50% Stand Pivot Transfers: 1: +2 Total assist;From elevated surface Stand Pivot Transfers: Patient Percentage: 40% Details for Transfer Assistance: VC's for hand placement on seated surface prior to initiating transfers. Ambulation/Gait Ambulation/Gait Assistance: Not tested (comment)    Exercises General Exercises - Lower Extremity Ankle Circles/Pumps: 10 reps Quad Sets: 10 reps   PT Diagnosis: Difficulty walking;Acute pain  PT Problem List: Decreased strength;Decreased range of motion;Decreased activity tolerance;Decreased balance;Decreased mobility;Decreased knowledge of use of DME;Decreased safety awareness;Decreased knowledge of precautions;Pain PT Treatment Interventions: DME instruction;Gait training;Stair training;Functional mobility training;Therapeutic activities;Therapeutic exercise;Neuromuscular re-education;Patient/family education     PT Goals(Current goals can be found in the care plan section) Acute Rehab PT Goals Patient Stated Goal: To return home PT Goal Formulation: With patient Time For Goal Achievement: 12/16/12 Potential to Achieve Goals: Good  Visit Information  Last PT Received On: 12/09/12 Assistance Needed: +2 History of Present Illness: Revision right total hip arthroplasty with removal of ASR cup and femoral head and revision        Prior Functioning  Home Living Family/patient expects to be discharged to:: Private residence Living Arrangements: Alone Available Help at Discharge: Available PRN/intermittently;Family;Friend(s) Type of Home:  House Home Access: Stairs to enter [employer] of Steps: 4 Entrance Stairs-Rails: None Home Layout: One  level Home Equipment: None Prior Function Level of Independence: Independent Communication Communication: No difficulties Dominant Hand: Right    Cognition  Cognition Arousal/Alertness: Awake/alert Behavior During Therapy: WFL for tasks assessed/performed Overall Cognitive Status: Within Functional Limits for tasks assessed    Extremity/Trunk Assessment Upper Extremity Assessment Upper Extremity Assessment: Defer to OT evaluation Lower Extremity Assessment Lower Extremity Assessment: LLE deficits/detail LLE Deficits / Details: Decreased strength and AROM consistent with total hip revision LLE: Unable to fully assess due to pain Cervical / Trunk Assessment Cervical / Trunk Assessment: Normal   Balance    End of Session PT - End of Session Equipment Utilized During Treatment: Gait belt Activity Tolerance: Patient limited by pain Patient left: in chair;with call bell/phone within reach Nurse Communication: Mobility status;Patient requests pain meds  GP     Ruthann Cancer 12/09/2012, 3:37 PM  Ruthann Cancer, PT, DPT [phone]

## 2012-12-09 NOTE — Progress Notes (Signed)
Patient ID: Altamese Dilling, male   DOB: 10-21-1955, 57 y.o.   MRN: [id number] PATIENT ID: DEJUAN ELMAN  MRN: [id number]  DOB/AGE:  02-22-1955 / 57 y.o.  1 Day Post-Op Procedure(s) (LRB): TOTAL HIP REVISION WITH HARDWARE REMOVAL ( BALL& CUP) (Left)    PROGRESS NOTE Subjective: Patient is alert, oriented,no Nausea, no Vomiting, yes passing gas, no Bowel Movement. Taking PO well. Denies SOB, Chest or Calf Pain. Using Incentive Spirometer, PAS in place. Ambulate WBAT Patient reports pain as 4 on 0-10 scale  .    Objective: Vital signs in last 24 hours: Filed Vitals:   12/08/12 1658 12/08/12 2056 12/09/12 0600 12/09/12 0627  BP: 123/62 125/60 131/72 134/77  Pulse: 66 62  52  Temp: 98.2 F (36.8 C) 98.7 F (37.1 C)  98.6 F (37 C)  TempSrc:      Resp: 18 18  18   SpO2: 96% 99%  99%      Intake/Output from previous day: I/O last 3 completed shifts: In: 1300 [I.V.:1300] Out: 5500 [Urine:5200; Blood:300]   Intake/Output this shift:     LABORATORY DATA:  Recent Labs  12/09/12 0545  WBC 11.1*  HGB 12.0*  HCT 35.1*  PLT 295    Examination: Neurologically intact ABD soft Neurovascular intact Sensation intact distally Intact pulses distally Dorsiflexion/Plantar flexion intact Incision: scant drainage No cellulitis present Compartment soft} XR AP&Lat of hip shows well placed\fixed THA  Assessment:   1 Day Post-Op Procedure(s) (LRB): TOTAL HIP REVISION WITH HARDWARE REMOVAL ( BALL& CUP) (Left) ADDITIONAL DIAGNOSIS:    Plan: PT/OT WBAT, THA  posterior precautions  DVT Prophylaxis: SCDx72 hrs, ASA 325 mg BID x 2 weeks  DISCHARGE PLAN: Home  DISCHARGE NEEDS: HHPT, HHRN, CPM, Walker and 3-in-1 comode seat

## 2012-12-09 NOTE — Progress Notes (Signed)
   CARE MANAGEMENT NOTE 12/09/2012  Patient:  Andre Harvey, Andre Harvey   Account Number:  [id number]  Date Initiated:  12/09/2012  Documentation initiated by:  [facility]  Subjective/Objective Assessment:   TOTAL HIP REVISION WITH HARDWARE REMOVAL ( BALL& CUP) (Left)     Action/Plan:   HH   Anticipated DC Date:  12/10/2012   Anticipated DC Plan:  HOME W HOME HEALTH SERVICES      DC Planning Services  CM consult      [facility] Choice  HOME HEALTH  Resumption Of Svcs/PTA Provider   Choice offered to / List presented to:  C-1 Patient        HH arranged  HH-2 PT  HH-3 OT  HH-1 RN      [facility] agency  [employer]   Status of service:  Completed, signed off Medicare Important Message given?   (If response is "NO", the following Medicare IM given date fields will be blank) Date Medicare IM given:   Date Additional Medicare IM given:    Discharge Disposition:  HOME W HOME HEALTH SERVICES  Per UR Regulation:    If discussed at Long Length of Stay Meetings, dates discussed:    Comments:  12/09/2012 1530 Pt had [facility] for [facility] and has DME (RW, and 3n1) at home from previous surgery. Isidoro Donning RN CCM Case Mgmt phone [phone]

## 2012-12-09 NOTE — Evaluation (Signed)
Occupational Therapy Evaluation Patient Details Name: Andre Harvey MRN: [id number] DOB: Nov 07, 1955 Today's Date: 12/09/2012 Time: 1200-1230 OT Time Calculation (min): 30 min  OT Assessment / Plan / Recommendation History of present illness Revision right total hip arthroplasty with removal of ASR cup and femoral head and revision    Clinical Impression   This 57 yo male admitted for above presents to acute OT with deficits below. Will benefit from acute OT without need for follow up.    OT Assessment  Patient needs continued OT Services    Follow Up Recommendations  No OT follow up       Equipment Recommendations  3 in 1 bedside comode       Frequency  Min 2X/week    Precautions / Restrictions Precautions Precautions: Posterior Hip;Fall Precaution Booklet Issued: Yes (comment) Precaution Comments: Pt able to remember only one precaution at end of session (no crossing legs) Restrictions Weight Bearing Restrictions: No   Pertinent Vitals/Pain 10+/10 with activity; repositioned and made RN aware    ADL  Eating/Feeding: Independent Where Assessed - Eating/Feeding: Chair Grooming: Set up Where Assessed - Grooming: Supported sitting Upper Body Bathing: Set up Where Assessed - Upper Body Bathing: Supported sitting Lower Body Bathing: +1 Total assistance (with additional +1 to stand) Where Assessed - Lower Body Bathing: Supported sit to stand Upper Body Dressing: Minimal assistance Where Assessed - Upper Body Dressing: Supported sitting Lower Body Dressing: +1 Total assistance Where Assessed - Lower Body Dressing: Supported sit to stand (with additional +1 to stand) Toilet Transfer: +2 Total assistance Toilet Transfer: Patient Percentage: 60% Statistician Method: Surveyor, minerals:  (Bed to recliner going to pt's right) Toileting - Clothing Manipulation and Hygiene: +1 Total assistance (with additional +1 to stand) Equipment Used: Gait  belt;Rolling walker Transfers/Ambulation Related to ADLs: total A +2 (pt=50%) sit<>stand    OT Diagnosis: Generalized weakness;Acute pain  OT Problem List: Decreased strength;Decreased range of motion;Decreased activity tolerance;Impaired balance (sitting and/or standing);Pain;Decreased knowledge of use of DME or AE OT Treatment Interventions: Self-care/ADL training;Patient/family education;DME and/or AE instruction;Balance training   OT Goals(Current goals can be found in the care plan section) Acute Rehab OT Goals OT Goal Formulation: With patient Time For Goal Achievement: 12/16/12 Potential to Achieve Goals: Good  Visit Information  Last OT Received On: 12/09/12 Assistance Needed: +2 PT/OT Co-Evaluation/Treatment: Yes History of Present Illness: Revision right total hip arthroplasty with removal of ASR cup and femoral head and revision        Prior Functioning     Home Living Family/patient expects to be discharged to:: Private residence Living Arrangements: Spouse/significant other;Other relatives Available Help at Discharge: Available PRN/intermittently;Family;Friend(s) Type of Home: House Home Access: Stairs to enter [employer] of Steps: 4 Entrance Stairs-Rails: None Home Layout: One level Home Equipment: None Prior Function Level of Independence: Independent Communication Communication: No difficulties Dominant Hand: Right         Vision/Perception Vision - History Patient Visual Report: No change from baseline   Cognition  Cognition Arousal/Alertness: Awake/alert Behavior During Therapy: WFL for tasks assessed/performed Overall Cognitive Status: Within Functional Limits for tasks assessed    Extremity/Trunk Assessment Upper Extremity Assessment Upper Extremity Assessment: Overall WFL for tasks assessed Lower Extremity Assessment Lower Extremity Assessment: Defer to PT evaluation     Mobility Bed Mobility Bed Mobility: Supine to  Sit;Sitting - Scoot to Edge of Bed Supine to Sit: 1: +2 Total assist;HOB elevated;With rails Supine to Sit: Patient Percentage: 20% Sitting -  Scoot to [place] of Bed: 4: Min assist (for LLE) Details for Bed Mobility Assistance: VCs for sequencing Transfers Transfers: Sit to Stand;Stand to Sit Sit to Stand: 1: +2 Total assist;With upper extremity assist;From bed Sit to Stand: Patient Percentage: 50% Stand to Sit: 1: +2 Total assist;With upper extremity assist;With armrests;To chair/3-in-1 Stand to Sit: Patient Percentage: 50% Details for Transfer Assistance: VCs for safe hand placement           End of Session OT - End of Session Equipment Utilized During Treatment: Gait belt;Rolling walker Activity Tolerance: Patient limited by pain Patient left: in chair;with call bell/phone within reach Nurse Communication: Mobility status;Patient requests pain meds       Evette Georges [phone] 12/09/2012, 12:55 PM

## 2012-12-09 NOTE — Progress Notes (Signed)
UR review completed. 

## 2012-12-10 ENCOUNTER — Encounter (HOSPITAL_COMMUNITY): Payer: Self-pay | Admitting: Orthopedic Surgery

## 2012-12-10 LAB — CBC
HCT: 37.4 % — ABNORMAL LOW (ref 39.0–52.0)
MCH: 28 pg (ref 26.0–34.0)
MCHC: 34 g/dL (ref 30.0–36.0)
RDW: 14.3 % (ref 11.5–15.5)

## 2012-12-10 NOTE — Discharge Summary (Signed)
Patient ID: Andre Harvey MRN: [id number] DOB/AGE: May 28, 1955 57 y.o.  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 PONV (postoperative nausea and vomiting)     x 1  . Osteoarthritis     Surgeries: Procedure(s): TOTAL HIP REVISION WITH HARDWARE REMOVAL ( BALL& CUP) on 12/08/2012   Consultants:    Discharged Condition: Improved  Hospital Course: FINDLEY VI is an 57 y.o. male who was admitted 12/08/2012 for operative treatment ofPain due to hip joint prosthesis. Patient has severe unremitting pain that affects sleep, daily activities, and work/hobbies. After pre-op clearance the patient was taken to the operating room on 12/08/2012 and underwent  Procedure(s): TOTAL HIP REVISION WITH HARDWARE REMOVAL ( BALL& CUP).    Patient was given perioperative antibiotics: Anti-infectives   Start     Dose/Rate Route Frequency Ordered Stop   12/08/12 0821  ceFAZolin (ANCEF) 2-3 GM-% IVPB SOLR    Comments:  Rogelia Mire   : cabinet override      12/08/12 0821 12/08/12 2029   12/08/12 0600  ceFAZolin (ANCEF) IVPB 2 g/50 mL premix     2 g 100 mL/hr over 30 Minutes Intravenous On call to O.R. 12/07/12 1428 12/08/12 1016       Patient was given sequential compression devices, early ambulation, and chemoprophylaxis to prevent DVT.  Patient benefited maximally from hospital stay and there were no complications.    Recent vital signs: Patient Vitals for the past 24 hrs:  BP Temp Pulse Resp SpO2  12/10/12 0540 130/72 mmHg 98.6 F (37 C) 75 18 99 %  12/09/12 2119 128/76 mmHg 98.5 F (36.9 C) 70 18 99 %  12/09/12 1600 - - - 16 -  12/09/12 1400 127/73 mmHg 98.2 F (36.8 C) 72 18 96 %  12/09/12 1200 - - - 16 96 %  12/09/12 0800 - - - 16 98 %     Recent laboratory studies:  Recent Labs   12/09/12 0545  WBC 11.1*  HGB 12.0*  HCT 35.1*  PLT 295  NA 137  K 3.7  CL 99  CO2 26  BUN 11  CREATININE 0.86  GLUCOSE 108*  CALCIUM 8.9     Discharge Medications:     Medication List    STOP taking these medications       diclofenac 75 MG EC tablet  Commonly known as:  VOLTAREN      TAKE these medications       ACYCLOVIR PO  Take by mouth.     aspirin EC 325 MG tablet  Take 1 tablet (325 mg total) by mouth 2 (two) times daily.     methocarbamol 500 MG tablet  Commonly known as:  ROBAXIN  Take 1 tablet (500 mg total) by mouth 2 (two) times daily with a meal.     oxyCODONE-acetaminophen 10-325 MG per tablet  Commonly known as:  PERCOCET  Take 1 tablet by mouth every 6 (six) hours as needed for pain.        Diagnostic Studies: Dg Chest 2 View  11/28/2012   CLINICAL DATA:  Preop hip surgery  EXAM: CHEST  2 VIEW  COMPARISON:  05/04/2007  FINDINGS: Heart size and vascularity are normal. Lungs are clear without infiltrate effusion or mass. Left shoulder hemiarthroplasty.  IMPRESSION: No active cardiopulmonary disease.   Electronically  Signed   By: Marlan Palau M.D.   On: 11/28/2012 13:54   Dg Pelvis Portable  12/08/2012   CLINICAL DATA:  Postoperative left hip replacement  EXAM: PORTABLE PELVIS 1-2 VIEWS  COMPARISON:  None.  FINDINGS: Left hip replacement is identified without malalignment. Postsurgical fluid and air is identified in the left hip. Degenerative joint changes of the right hip are noted. There is chronic deformity of the right superior pubic rami.  IMPRESSION: Left hip replacement identified without malalignment.   Electronically Signed   By: Sherian Rein M.D.   On: 12/08/2012 16:38    Disposition:       Discharge Orders   Future Orders Complete By Expires   Call MD / Call 911  As directed    Comments:     If you experience chest pain or shortness of breath, CALL 911 and be transported to the hospital emergency room.  If you develope a fever  above 101 F, pus (white drainage) or increased drainage or redness at the wound, or calf pain, call your surgeon's office.   Change dressing  As directed    Comments:     You may change your dressing on day 5, then change the dressing daily with sterile 4 x 4 inch gauze dressing and paper tape.  You may clean the incision with alcohol prior to redressing   Constipation Prevention  As directed    Comments:     Drink plenty of fluids.  Prune juice may be helpful.  You may use a stool softener, such as Colace (over the counter) 100 mg twice a day.  Use MiraLax (over the counter) for constipation as needed.   Diet - low sodium heart healthy  As directed    Discharge instructions  As directed    Comments:     Follow up in office with Dr. Turner Daniels in 2 weeks.   Driving restrictions  As directed    Comments:     No driving for 2 weeks   Follow the hip precautions as taught in Physical Therapy  As directed    Increase activity slowly as tolerated  As directed    Lifting restrictions  As directed    Comments:     No lifting for 4 weeks   Patient may shower  As directed    Comments:     You may shower without a dressing once there is no drainage.  Do not wash over the wound.  If drainage remains, cover wound with plastic wrap and then shower.      Follow-up Information   Follow up with [facility]. (Home Health Physical Therapy, RN and Occupational Therapy)    Contact information:   [address] [place] [address] [phone]       Follow up with [name], MD In 2 weeks.   Specialty:  Orthopedic Surgery   Contact information:   [address] [place] [address] [phone]        Signed: Henry Russel 12/10/2012, 7:51 AM

## 2012-12-10 NOTE — Progress Notes (Signed)
PATIENT ID: Andre Harvey  MRN: [id number]  DOB/AGE:  1955-07-28 / 57 y.o.  2 Days Post-Op Procedure(s) (LRB): TOTAL HIP REVISION WITH HARDWARE REMOVAL ( BALL& CUP) (Left)    PROGRESS NOTE Subjective: Patient is alert, oriented,no Nausea, no Vomiting, yes passing gas, no Bowel Movement. Taking PO well, pt up eating in bed. Denies SOB, Chest or Calf Pain. Using Incentive Spirometer, PAS in place. Ambulate WBAT with posterior hip precautions. Patient reports pain as moderate  .    Objective: Vital signs in last 24 hours: Filed Vitals:   12/09/12 1400 12/09/12 1600 12/09/12 2119 12/10/12 0540  BP: 127/73  128/76 130/72  Pulse: 72  70 75  Temp: 98.2 F (36.8 C)  98.5 F (36.9 C) 98.6 F (37 C)  TempSrc:      Resp: 18 16 18 18   SpO2: 96%  99% 99%      Intake/Output from previous day: I/O last 3 completed shifts: In: 1080 [P.O.:1080] Out: 6850 [Urine:6850]   Intake/Output this shift:     LABORATORY DATA:  Recent Labs  12/09/12 0545  WBC 11.1*  HGB 12.0*  HCT 35.1*  PLT 295  NA 137  K 3.7  CL 99  CO2 26  BUN 11  CREATININE 0.86  GLUCOSE 108*  CALCIUM 8.9    Examination: Neurologically intact ABD soft Neurovascular intact Sensation intact distally Intact pulses distally Dorsiflexion/Plantar flexion intact Incision: dressing C/D/I and no drainage No cellulitis present Compartment soft} XR AP&Lat of hip shows well placed\fixed THA  Assessment:   2 Days Post-Op Procedure(s) (LRB): TOTAL HIP REVISION WITH HARDWARE REMOVAL ( BALL& CUP) (Left) ADDITIONAL DIAGNOSIS:    Plan: PT/OT WBAT, THA  posterior precautions  DVT Prophylaxis: SCDx72 hrs, ASA 325 mg BID x 2 weeks  DISCHARGE PLAN: Home when pt passes PT.  DISCHARGE NEEDS: HHPT, HHRN, Walker and 3-in-1 comode seat

## 2012-12-10 NOTE — Progress Notes (Signed)
Physical Therapy Treatment Patient Details Name: Andre Harvey MRN: [id number] DOB: Dec 03, 1955 Today's Date: 12/10/2012 Time: 0950-1013 PT Time Calculation (min): 23 min  PT Assessment / Plan / Recommendation  History of Present Illness Revision right total hip arthroplasty with removal of ASR cup and femoral head and revision    PT Comments   Much better gait and transfers than yesterday; Pt reports he has used crutches for a while and overall did well with them, especially for ascending and descending steps; Still, recommend RW as it has a bigger base of support   Follow Up Recommendations  Home health PT If HHPT is not available to pt, will recommend Outpatient PT follow-up     Does the patient have the potential to tolerate intense rehabilitation     Barriers to Discharge        Equipment Recommendations  Rolling walker with 5" wheels;3in1 (PT)    Recommendations for Other Services    Frequency 7X/week   Progress towards PT Goals Progress towards PT goals: Progressing toward goals  Plan Current plan remains appropriate    Precautions / Restrictions Precautions Precautions: Posterior Hip;Fall Precaution Comments: Patient able to recall 3/3 precautions, however requires cues to maintain precautions with functional mobility.  Restrictions LLE Weight Bearing: Weight bearing as tolerated   Pertinent Vitals/Pain Significant pain LLE, pt did not rate, but able to particiapte patient repositioned for comfort     Mobility  Bed Mobility Bed Mobility: Not assessed Transfers Transfers: Sit to Stand;Stand to Sit Sit to Stand: 4: Min guard Stand to Sit: 4: Min guard Details for Transfer Assistance: VC's for hand placement on seated surface prior to initiating transfers. Ambulation/Gait Ambulation/Gait Assistance: 4: Min guard;5: Supervision Ambulation Distance (Feet): 200 Feet Assistive device: Rolling walker;Crutches Ambulation/Gait Assistance Details: Cues to keep  precautions with turns; Pt overall did quite well with crutches (which he has at home); Still, recommend RW use as it has a bigger base of support Gait Pattern: Step-through pattern Stairs: Yes Stairs Assistance: 4: Min guard Stair Management Technique: No rails;Step to pattern;With crutches Number of Stairs: 5 (Managed well with crutches)    Exercises     PT Diagnosis:    PT Problem List:   PT Treatment Interventions:     PT Goals (current goals can now be found in the care plan section) Acute Rehab PT Goals Patient Stated Goal: To return home PT Goal Formulation: With patient Time For Goal Achievement: 12/16/12 Potential to Achieve Goals: Good  Visit Information  Last PT Received On: 12/10/12 Assistance Needed: +1 History of Present Illness: Revision right total hip arthroplasty with removal of ASR cup and femoral head and revision     Subjective Data  Subjective: Really wanting to try crutches Patient Stated Goal: To return home   Cognition  Cognition Arousal/Alertness: Awake/alert Behavior During Therapy: WFL for tasks assessed/performed Overall Cognitive Status: Within Functional Limits for tasks assessed    Balance     End of Session PT - End of Session Activity Tolerance: Patient tolerated treatment well Patient left: in chair;with call bell/phone within reach Nurse Communication: Mobility status   GP     Van Clines Hamff 12/10/2012, 1:10 PM [place], [place] [phone]

## 2012-12-10 NOTE — Progress Notes (Signed)
OT Cancellation Note  Patient Details Name: Andre Harvey MRN: [id number] DOB: 1955/09/17   Cancelled Treatment:    Reason Eval/Treat Not Completed: Patient declined. OT attempted to educate/review ADL A/E with pt. Pt stated that he was going home today and had all the DME and equipment he needed  Galen Manila 12/10/2012, 1:37 PM

## 2013-03-06 ENCOUNTER — Ambulatory Visit: Payer: PRIVATE HEALTH INSURANCE | Attending: Orthopedic Surgery

## 2013-03-06 DIAGNOSIS — R262 Difficulty in walking, not elsewhere classified: Secondary | ICD-10-CM | POA: Insufficient documentation

## 2013-03-06 DIAGNOSIS — IMO0001 Reserved for inherently not codable concepts without codable children: Secondary | ICD-10-CM | POA: Insufficient documentation

## 2013-03-06 DIAGNOSIS — M6281 Muscle weakness (generalized): Secondary | ICD-10-CM | POA: Insufficient documentation

## 2013-03-06 DIAGNOSIS — M25559 Pain in unspecified hip: Secondary | ICD-10-CM | POA: Insufficient documentation

## 2013-03-12 ENCOUNTER — Encounter: Payer: Self-pay | Admitting: Physical Therapy

## 2013-03-12 ENCOUNTER — Ambulatory Visit: Payer: PRIVATE HEALTH INSURANCE | Admitting: Physical Therapy

## 2013-03-13 ENCOUNTER — Ambulatory Visit: Payer: PRIVATE HEALTH INSURANCE

## 2013-03-17 ENCOUNTER — Ambulatory Visit: Payer: PRIVATE HEALTH INSURANCE | Attending: Orthopedic Surgery | Admitting: Rehabilitation

## 2013-03-17 DIAGNOSIS — R262 Difficulty in walking, not elsewhere classified: Secondary | ICD-10-CM | POA: Insufficient documentation

## 2013-03-17 DIAGNOSIS — M25559 Pain in unspecified hip: Secondary | ICD-10-CM | POA: Insufficient documentation

## 2013-03-17 DIAGNOSIS — IMO0001 Reserved for inherently not codable concepts without codable children: Secondary | ICD-10-CM | POA: Insufficient documentation

## 2013-03-17 DIAGNOSIS — M6281 Muscle weakness (generalized): Secondary | ICD-10-CM | POA: Insufficient documentation

## 2013-03-19 ENCOUNTER — Ambulatory Visit: Payer: PRIVATE HEALTH INSURANCE | Admitting: Rehabilitation

## 2013-03-24 ENCOUNTER — Ambulatory Visit: Payer: PRIVATE HEALTH INSURANCE

## 2013-03-26 ENCOUNTER — Ambulatory Visit: Payer: PRIVATE HEALTH INSURANCE

## 2014-01-12 ENCOUNTER — Encounter (HOSPITAL_COMMUNITY): Payer: Self-pay | Admitting: Emergency Medicine

## 2014-01-12 ENCOUNTER — Emergency Department (HOSPITAL_COMMUNITY)
Admission: EM | Admit: 2014-01-12 | Discharge: 2014-01-12 | Disposition: A | Discharge status: Home / Self Care | Payer: No Typology Code available for payment source | Attending: Emergency Medicine | Admitting: Emergency Medicine

## 2014-01-12 DIAGNOSIS — R2232 Localized swelling, mass and lump, left upper limb: Secondary | ICD-10-CM | POA: Insufficient documentation

## 2014-01-12 DIAGNOSIS — Z792 Long term (current) use of antibiotics: Secondary | ICD-10-CM | POA: Insufficient documentation

## 2014-01-12 DIAGNOSIS — Z720 Tobacco use: Secondary | ICD-10-CM | POA: Diagnosis not present

## 2014-01-12 DIAGNOSIS — M1990 Unspecified osteoarthritis, unspecified site: Secondary | ICD-10-CM | POA: Diagnosis not present

## 2014-01-12 DIAGNOSIS — Z7982 Long term (current) use of aspirin: Secondary | ICD-10-CM | POA: Insufficient documentation

## 2014-01-12 DIAGNOSIS — M7989 Other specified soft tissue disorders: Secondary | ICD-10-CM

## 2014-01-12 MED ORDER — SULFAMETHOXAZOLE-TRIMETHOPRIM 800-160 MG PO TABS: 1.0000 | ORAL_TABLET | Freq: Two times a day (BID) | ORAL | Status: AC

## 2014-01-12 NOTE — ED Notes (Signed)
Pt c/o left hand swelling after having back sx this am; pt sts IV was in that hand and is worried about infection

## 2014-01-12 NOTE — ED Provider Notes (Signed)
CSN: [id number]     Arrival date & time 01/12/14  1545 History   First MD Initiated Contact with Patient 01/12/14 1933     Chief Complaint  Patient presents with  . Arm Swelling     (Consider location/radiation/quality/duration/timing/severity/associated sxs/prior Treatment) HPI   58yM left hand and forearm swelling. Patient had back surgery earlier today. He states that shortly after his procedure and before he left the surgical center he noted some swelling of his left heel. This has progressively worsened. He did have an intravenous line placed in his distal forearm. He denies any pain. No numbness or tingling. No difficulty with range of motion of his wrist or hand. No fevers no chills.  Past Medical History  Diagnosis Date  . PONV (postoperative nausea and vomiting)     x 1  . Osteoarthritis    Past Surgical History  Procedure Laterality Date  . Hip arthroplasty Left   . Shoulder surgery      left and right  . Umbilical hernia repair    . 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 No pertinent family history. History  Substance Use Topics  . Smoking status: Light Tobacco Smoker    Types: Cigars  . Smokeless tobacco: Never Used     Comment: reports box last 3- 4 months  . Alcohol Use: Yes     Comment: reports drinks about every 2- 3 months reports used to be heavy    Review of Systems  All systems reviewed and negative, other than as noted in HPI.   Allergies  Codeine  Home Medications   Prior to Admission medications   Medication Sig Start Date End Date Taking? Authorizing Provider  ACYCLOVIR PO Take by mouth.    Historical Provider, MD  aspirin EC 325 MG tablet Take 1 tablet (325 mg total) by mouth 2 (two) times daily. 12/08/12   Leighton Parody, PA-C  methocarbamol (ROBAXIN) 500 MG tablet Take 1 tablet (500 mg total) by mouth 2 (two) times daily with a meal. 12/08/12   Leighton Parody, PA-C  oxyCODONE-acetaminophen (PERCOCET) 10-325 MG per tablet Take 1 tablet by mouth every 6 (six) hours as needed for pain. 12/08/12   Leighton Parody, PA-C  sulfamethoxazole-trimethoprim (BACTRIM DS,SEPTRA DS) 800-160 MG per tablet Take 1 tablet by mouth 2 (two) times daily. 01/12/14 01/19/14  Virgel Manifold, MD   BP 116/64 mmHg  Pulse 60  Temp(Src) 98.5 F (36.9 C) (Oral)  Resp 16  SpO2 97% Physical Exam  Constitutional: He appears well-developed and well-nourished. No distress.  HENT:  Head: Normocephalic and atraumatic.  Eyes: Conjunctivae are normal. Right eye exhibits no discharge. Left eye exhibits no discharge.  Neck: Neck supple.  Cardiovascular: Normal rate, regular rhythm and normal heart sounds.  Exam reveals no gallop and no friction rub.   No murmur heard. Pulmonary/Chest: Effort normal and breath sounds normal. No respiratory distress.  Abdominal: Soft. He exhibits no distension. There is no tenderness.  Musculoskeletal: He exhibits no edema or tenderness.  Punctate area distal radial wrist consistent with recent IV placement. Mild diffuse swelling or hand extending proximally to distal forearm. Nontender. No erythema. No increased warm. Able to flex/extend all finger and wrist w/o pain. Compartments soft.  Brisk cap refill in fingers.   Neurological: He is alert.  Skin: Skin is warm and dry.  Psychiatric: He has a normal mood and affect. His behavior is normal. Thought content normal.  Nursing note and vitals reviewed.   ED Course  Procedures (including critical care time) Labs Review Labs Reviewed - No data to display  Imaging Review No results found.   EKG Interpretation None      MDM    Final diagnoses:  Left arm swelling    58yM with L hand/forearm swelling. Suspect his IV during surgery may have  infiltrated. Less likely positional injury during surgery, DVT or infection. Pt reports had some swelling even before he left after surgery. This and lack of pain is not consistent with an infectious process although pt is very concerned about it. Requesting abx. I don't think this is completely unreasonable, although my suspicion for infection is low. NVI intact. Compartments soft. Return precautions discussed. FU with Dr Saintclair Halsted as previously arranged otherwise.   Virgel Manifold, MD 01/17/14 1640

## 2017-07-30 ENCOUNTER — Encounter (INDEPENDENT_AMBULATORY_CARE_PROVIDER_SITE_OTHER): Payer: Self-pay | Admitting: Internal Medicine

## 2017-07-30 ENCOUNTER — Ambulatory Visit (INDEPENDENT_AMBULATORY_CARE_PROVIDER_SITE_OTHER): Payer: No Typology Code available for payment source | Admitting: Internal Medicine

## 2017-07-30 VITALS — BP 144/82 | HR 68 | Temp 98.3°F | Ht 69.0 in | Wt 184.1 lb

## 2017-07-30 DIAGNOSIS — K625 Hemorrhage of anus and rectum: Secondary | ICD-10-CM | POA: Diagnosis not present

## 2017-07-30 NOTE — Progress Notes (Signed)
   Subjective:    Patient ID: Andre Harvey, male    DOB: [birth year], 62 y.o.   MRN: [id number] (did not bring medication list). Medications reviewed in Epic with patient and with referral.  HPI Referred by Sena Slate NP for positive hemoccult. He also tells me he saw blood. Last saw blood in his stool about 2 weeks go. Describes as BRRB. He says sometimes it will "gush out". Has a BM daily. No weight loss. Appetite is okay. No family hx of colon cancer. He states he has never undergone a colonoscopy in the past. ( I do not see a colonoscopy in Epic either).      Divorced. Disabled.  Review of Systems Past Medical History:  Diagnosis Date  . Osteoarthritis   . PONV (postoperative nausea and vomiting)    x 1    Past Surgical History:  Procedure Laterality Date  . hip arthroplasty Left   . SHOULDER SURGERY     left and right  . TOTAL HIP REVISION Left 12/08/2012   Dr Mayer Camel  . TOTAL HIP REVISION Left 12/08/2012   Procedure: TOTAL HIP REVISION WITH HARDWARE REMOVAL ( BALL& CUP);  Surgeon: Kerin Salen, MD;  Location: [facility];  Service: Orthopedics;  Laterality: Left;  . UMBILICAL HERNIA REPAIR      Allergies  Allergen Reactions  . Codeine Nausea And Vomiting    Current Outpatient Medications on File Prior to Visit  Medication Sig Dispense Refill  . ACYCLOVIR PO Take by mouth 2 (two) times daily after a meal.     . aspirin EC 325 MG tablet Take 1 tablet (325 mg total) by mouth 2 (two) times daily. 30 tablet 0  . diclofenac (VOLTAREN) 75 MG EC tablet Take 75 mg by mouth daily.     . naloxone (NARCAN) nasal spray 4 mg/0.1 mL Place 1 spray into the nose.    . oxyCODONE-acetaminophen (PERCOCET/ROXICET) 5-325 MG tablet Take by mouth. Takes 5 times a day.    . simvastatin (ZOCOR) 20 MG tablet Take 20 mg by mouth. Twice a week     No current facility-administered medications on file prior to visit.         Objective:   Physical Exam Blood pressure (!) 144/82, pulse 68,  temperature 98.3 F (36.8 C), height 5\' 9"  (1.753 m), weight 184 lb 1.6 oz (83.5 kg). Alert and oriented. Skin warm and dry. Oral mucosa is moist.   . Sclera anicteric, conjunctivae is pink. Thyroid not enlarged. No cervical lymphadenopathy. Lungs clear. Heart regular rate and rhythm.  Abdomen is soft. Bowel sounds are positive. No hepatomegaly. No abdominal masses felt. No tenderness.  No edema to lower extremities.           Assessment & Plan:  Rectal bleeding. Colonic neoplasm needs to be ruled out. Hemorroids, colon polyps needs to be ruled.

## 2017-07-30 NOTE — Patient Instructions (Signed)
The risks of bleeding, perforation and infection were reviewed with patient.  

## 2017-07-31 ENCOUNTER — Telehealth (INDEPENDENT_AMBULATORY_CARE_PROVIDER_SITE_OTHER): Payer: Self-pay | Admitting: *Deleted

## 2017-07-31 ENCOUNTER — Other Ambulatory Visit (INDEPENDENT_AMBULATORY_CARE_PROVIDER_SITE_OTHER): Payer: Self-pay | Admitting: Internal Medicine

## 2017-07-31 ENCOUNTER — Encounter (INDEPENDENT_AMBULATORY_CARE_PROVIDER_SITE_OTHER): Payer: Self-pay | Admitting: *Deleted

## 2017-07-31 DIAGNOSIS — K625 Hemorrhage of anus and rectum: Secondary | ICD-10-CM | POA: Insufficient documentation

## 2017-07-31 DIAGNOSIS — R195 Other fecal abnormalities: Secondary | ICD-10-CM

## 2017-07-31 MED ORDER — PEG 3350-KCL-NA BICARB-NACL 420 G PO SOLR: 4000.0000 mL | Freq: Once | ORAL | 0 refills | Status: AC

## 2017-07-31 NOTE — Telephone Encounter (Signed)
Patient needs trilyte 

## 2017-08-14 NOTE — Patient Instructions (Signed)
Andre Harvey  08/14/2017     @PREFPERIOPPHARMACY @   Your procedure is scheduled on  08/23/2017   Report to [facility] at  49  A.M.  Call this number if you have problems the morning of surgery:  [phone]   Remember:  Do not eat or drink after midnight.  You may drink clear liquids until (follow the instructions given to you) .  Clear liquids allowed are:                    Water, Juice (non-citric and without pulp), Carbonated beverages, Clear Tea, Black Coffee only, Plain Jell-O only, Gatorade and Plain Popsicles only    Take these medicines the morning of surgery with A SIP OF WATER  Zivirax, voltaren, oxycodone (if needed).    Do not wear jewelry, make-up or nail polish.  Do not wear lotions, powders, or perfumes, or deodorant.  Do not shave 48 hours prior to surgery.  Men may shave face and neck.  Do not bring valuables to the hospital.  [facility] is not responsible for any belongings or valuables.  Contacts, dentures or bridgework may not be worn into surgery.  Leave your suitcase in the car.  After surgery it may be brought to your room.  For patients admitted to the hospital, discharge time will be determined by your treatment team.  Patients discharged the day of surgery will not be allowed to drive home.   Name and phone number of your driver:   family Special instructions:  Follow the diet and prep instructions given to you by Dr Olevia Perches office.  Please read over the following fact sheets that you were given. Anesthesia Post-op Instructions and Care and Recovery After Surgery       Colonoscopy, Adult A colonoscopy is an exam to look at the large intestine. It is done to check for problems, such as:  Lumps (tumors).  Growths (polyps).  Swelling (inflammation).  Bleeding.  What happens before the procedure? Eating and drinking Follow instructions from your doctor about eating and drinking. These instructions may include:  A  few days before the procedure - follow a low-fiber diet. ? Avoid nuts. ? Avoid seeds. ? Avoid dried fruit. ? Avoid raw fruits. ? Avoid vegetables.  1-3 days before the procedure - follow a clear liquid diet. Avoid liquids that have red or purple dye. Drink only clear liquids, such as: ? Clear broth or bouillon. ? Black coffee or tea. ? Clear juice. ? Clear soft drinks or sports drinks. ? Gelatin dessert. ? Popsicles.  On the day of the procedure - do not eat or drink anything during the 2 hours before the procedure.  Bowel prep If you were prescribed an oral bowel prep:  Take it as told by your doctor. Starting the day before your procedure, you will need to drink a lot of liquid. The liquid will cause you to poop (have bowel movements) until your poop is almost clear or light green.  If your skin or butt gets irritated from diarrhea, you may: ? Wipe the area with wipes that have medicine in them, such as adult wet wipes with aloe and vitamin E. ? Put something on your skin that soothes the area, such as petroleum jelly.  If you throw up (vomit) while drinking the bowel prep, take a break for up to 60 minutes. Then begin the bowel prep again. If you keep throwing  up and you cannot take the bowel prep without throwing up, call your doctor.  General instructions  Ask your doctor about changing or stopping your normal medicines. This is important if you take diabetes medicines or blood thinners.  Plan to have someone take you home from the hospital or clinic. What happens during the procedure?  An IV tube may be put into one of your veins.  You will be given medicine to help you relax (sedative).  To reduce your risk of infection: ? Your doctors will wash their hands. ? Your anal area will be washed with soap.  You will be asked to lie on your side with your knees bent.  Your doctor will get a long, thin, flexible tube ready. The tube will have a camera and a light on the  end.  The tube will be put into your anus.  The tube will be gently put into your large intestine.  Air will be delivered into your large intestine to keep it open. You may feel some pressure or cramping.  The camera will be used to take photos.  A small tissue sample may be removed from your body to be looked at under a microscope (biopsy). If any possible problems are found, the tissue will be sent to a lab for testing.  If small growths are found, your doctor may remove them and have them checked for cancer.  The tube that was put into your anus will be slowly removed. The procedure may vary among doctors and hospitals. What happens after the procedure?  Your doctor will check on you often until the medicines you were given have worn off.  Do not drive for 24 hours after the procedure.  You may have a small amount of blood in your poop.  You may pass gas.  You may have mild cramps or bloating in your belly (abdomen).  It is up to you to get the results of your procedure. Ask your doctor, or the department performing the procedure, when your results will be ready. This information is not intended to replace advice given to you by your health care provider. Make sure you discuss any questions you have with your health care provider. Document Released: 02/03/2010 Document Revised: 11/02/2015 Document Reviewed: 03/15/2015 Elsevier Interactive Patient Education  2017 [employer]  Colonoscopy, Adult, Care After This sheet gives you information about how to care for yourself after your procedure. Your health care provider may also give you more specific instructions. If you have problems or questions, contact your health care provider. What can I expect after the procedure? After the procedure, it is common to have:  A small amount of blood in your stool for 24 hours after the procedure.  Some gas.  Mild abdominal cramping or bloating.  Follow these instructions at  home: General instructions   For the first 24 hours after the procedure: ? Do not drive or use machinery. ? Do not sign important documents. ? Do not drink alcohol. ? Do your regular daily activities at a slower pace than normal. ? Eat soft, easy-to-digest foods. ? Rest often.  Take over-the-counter or prescription medicines only as told by your health care provider.  It is up to you to get the results of your procedure. Ask your health care provider, or the department performing the procedure, when your results will be ready. Relieving cramping and bloating  Try walking around when you have cramps or feel bloated.  Apply heat to your abdomen  as told by your health care provider. Use a heat source that your health care provider recommends, such as a moist heat pack or a heating pad. ? Place a towel between your skin and the heat source. ? Leave the heat on for 20-30 minutes. ? Remove the heat if your skin turns bright red. This is especially important if you are unable to feel pain, heat, or cold. You may have a greater risk of getting burned. Eating and drinking  Drink enough fluid to keep your urine clear or pale yellow.  Resume your normal diet as instructed by your health care provider. Avoid heavy or fried foods that are hard to digest.  Avoid drinking alcohol for as long as instructed by your health care provider. Contact a health care provider if:  You have blood in your stool 2-3 days after the procedure. Get help right away if:  You have more than a small spotting of blood in your stool.  You pass large blood clots in your stool.  Your abdomen is swollen.  You have nausea or vomiting.  You have a fever.  You have increasing abdominal pain that is not relieved with medicine. This information is not intended to replace advice given to you by your health care provider. Make sure you discuss any questions you have with your health care provider. Document Released:  08/16/2003 Document Revised: 09/26/2015 Document Reviewed: 03/15/2015 Elsevier Interactive Patient Education  2018 [place] Anesthesia is a term that refers to techniques, procedures, and medicines that help a person stay safe and comfortable during a medical procedure. Monitored anesthesia care, or sedation, is one type of anesthesia. Your anesthesia specialist may recommend sedation if you will be having a procedure that does not require you to be unconscious, such as:  Cataract surgery.  A dental procedure.  A biopsy.  A colonoscopy.  During the procedure, you may receive a medicine to help you relax (sedative). There are three levels of sedation:  Mild sedation. At this level, you may feel awake and relaxed. You will be able to follow directions.  Moderate sedation. At this level, you will be sleepy. You may not remember the procedure.  Deep sedation. At this level, you will be asleep. You will not remember the procedure.  The more medicine you are given, the deeper your level of sedation will be. Depending on how you respond to the procedure, the anesthesia specialist may change your level of sedation or the type of anesthesia to fit your needs. An anesthesia specialist will monitor you closely during the procedure. Let your health care provider know about:  Any allergies you have.  All medicines you are taking, including vitamins, herbs, eye drops, creams, and over-the-counter medicines.  Any use of steroids (by mouth or as a cream).  Any problems you or family members have had with sedatives and anesthetic medicines.  Any blood disorders you have.  Any surgeries you have had.  Any medical conditions you have, such as sleep apnea.  Whether you are pregnant or may be pregnant.  Any use of cigarettes, alcohol, or street drugs. What are the risks? Generally, this is a safe procedure. However, problems may occur, including:  Getting too  much medicine (oversedation).  Nausea.  Allergic reaction to medicines.  Trouble breathing. If this happens, a breathing tube may be used to help with breathing. It will be removed when you are awake and breathing on your own.  Heart trouble.  Lung  trouble.  Before the procedure Staying hydrated Follow instructions from your health care provider about hydration, which may include:  Up to 2 hours before the procedure - you may continue to drink clear liquids, such as water, clear fruit juice, black coffee, and plain tea.  Eating and drinking restrictions Follow instructions from your health care provider about eating and drinking, which may include:  8 hours before the procedure - stop eating heavy meals or foods such as meat, fried foods, or fatty foods.  6 hours before the procedure - stop eating light meals or foods, such as toast or cereal.  6 hours before the procedure - stop drinking milk or drinks that contain milk.  2 hours before the procedure - stop drinking clear liquids.  Medicines Ask your health care provider about:  Changing or stopping your regular medicines. This is especially important if you are taking diabetes medicines or blood thinners.  Taking medicines such as aspirin and ibuprofen. These medicines can thin your blood. Do not take these medicines before your procedure if your health care provider instructs you not to.  Tests and exams  You will have a physical exam.  You may have blood tests done to show: ? How well your kidneys and liver are working. ? How well your blood can clot.  General instructions  Plan to have someone take you home from the hospital or clinic.  If you will be going home right after the procedure, plan to have someone with you for 24 hours.  What happens during the procedure?  Your blood pressure, heart rate, breathing, level of pain and overall condition will be monitored.  An IV tube will be inserted into one of  your veins.  Your anesthesia specialist will give you medicines as needed to keep you comfortable during the procedure. This may mean changing the level of sedation.  The procedure will be performed. After the procedure  Your blood pressure, heart rate, breathing rate, and blood oxygen level will be monitored until the medicines you were given have worn off.  Do not drive for 24 hours if you received a sedative.  You may: ? Feel sleepy, clumsy, or nauseous. ? Feel forgetful about what happened after the procedure. ? Have a sore throat if you had a breathing tube during the procedure. ? Vomit. This information is not intended to replace advice given to you by your health care provider. Make sure you discuss any questions you have with your health care provider. Document Released: 09/27/2004 Document Revised: 06/10/2015 Document Reviewed: 04/24/2015 Elsevier Interactive Patient Education  2018 [place], Care After These instructions provide you with information about caring for yourself after your procedure. Your health care provider may also give you more specific instructions. Your treatment has been planned according to current medical practices, but problems sometimes occur. Call your health care provider if you have any problems or questions after your procedure. What can I expect after the procedure? After your procedure, it is common to:  Feel sleepy for several hours.  Feel clumsy and have poor balance for several hours.  Feel forgetful about what happened after the procedure.  Have poor judgment for several hours.  Feel nauseous or vomit.  Have a sore throat if you had a breathing tube during the procedure.  Follow these instructions at home: For at least 24 hours after the procedure:   Do not: ? Participate in activities in which you could fall or become injured. ?  Drive. ? Use heavy machinery. ? Drink alcohol. ? Take sleeping pills  or medicines that cause drowsiness. ? Make important decisions or sign legal documents. ? Take care of children on your own.  Rest. Eating and drinking  Follow the diet that is recommended by your health care provider.  If you vomit, drink water, juice, or soup when you can drink without vomiting.  Make sure you have little or no nausea before eating solid foods. General instructions  Have a responsible adult stay with you until you are awake and alert.  Take over-the-counter and prescription medicines only as told by your health care provider.  If you smoke, do not smoke without supervision.  Keep all follow-up visits as told by your health care provider. This is important. Contact a health care provider if:  You keep feeling nauseous or you keep vomiting.  You feel light-headed.  You develop a rash.  You have a fever. Get help right away if:  You have trouble breathing. This information is not intended to replace advice given to you by your health care provider. Make sure you discuss any questions you have with your health care provider. Document Released: 04/24/2015 Document Revised: 08/24/2015 Document Reviewed: 04/24/2015 Elsevier Interactive Patient Education  [employer].

## 2017-08-16 ENCOUNTER — Encounter (HOSPITAL_COMMUNITY): Payer: Self-pay

## 2017-08-16 ENCOUNTER — Encounter (HOSPITAL_COMMUNITY)
Admission: RE | Admit: 2017-08-16 | Discharge: 2017-08-16 | Disposition: A | Discharge status: Home / Self Care | Payer: No Typology Code available for payment source | Source: Ambulatory Visit | Attending: Internal Medicine | Admitting: Internal Medicine

## 2017-08-16 ENCOUNTER — Other Ambulatory Visit: Payer: Self-pay

## 2017-08-16 DIAGNOSIS — R195 Other fecal abnormalities: Secondary | ICD-10-CM | POA: Diagnosis not present

## 2017-08-16 DIAGNOSIS — Z01810 Encounter for preprocedural cardiovascular examination: Secondary | ICD-10-CM | POA: Diagnosis not present

## 2017-08-16 DIAGNOSIS — K625 Hemorrhage of anus and rectum: Secondary | ICD-10-CM | POA: Insufficient documentation

## 2017-08-16 DIAGNOSIS — R9431 Abnormal electrocardiogram [ECG] [EKG]: Secondary | ICD-10-CM | POA: Diagnosis not present

## 2017-08-16 DIAGNOSIS — Z01812 Encounter for preprocedural laboratory examination: Secondary | ICD-10-CM | POA: Insufficient documentation

## 2017-08-16 LAB — BASIC METABOLIC PANEL
ANION GAP: 10 (ref 5–15)
BUN: 18 mg/dL (ref 8–23)
CHLORIDE: 104 mmol/L (ref 98–111)
CO2: 23 mmol/L (ref 22–32)
Calcium: 8.9 mg/dL (ref 8.9–10.3)
Creatinine, Ser: 0.78 mg/dL (ref 0.61–1.24)
GFR calc non Af Amer: >60 mL/min (ref >60)
Glucose, Bld: 106 mg/dL — ABNORMAL HIGH (ref 70–99)
POTASSIUM: 4 mmol/L (ref 3.5–5.1)
Sodium: 137 mmol/L (ref 135–145)

## 2017-08-16 LAB — CBC WITH DIFFERENTIAL/PLATELET
Basophils Absolute: 0 10*3/uL (ref 0.0–0.1)
Basophils Relative: 0 %
Eosinophils Absolute: 0.4 10*3/uL (ref 0.0–0.7)
Eosinophils Relative: 3 %
HEMATOCRIT: 41.6 % (ref 39.0–52.0)
HEMOGLOBIN: 13.8 g/dL (ref 13.0–17.0)
LYMPHS ABS: 2 10*3/uL (ref 0.7–4.0)
LYMPHS PCT: 18 %
MCH: 27.7 pg (ref 26.0–34.0)
MCHC: 33.2 g/dL (ref 30.0–36.0)
MCV: 83.4 fL (ref 78.0–100.0)
MONO ABS: 1.1 10*3/uL — AB (ref 0.1–1.0)
MONOS PCT: 10 %
NEUTROS ABS: 7.8 10*3/uL — AB (ref 1.7–7.7)
NEUTROS PCT: 69 %
Platelets: 508 10*3/uL — ABNORMAL HIGH (ref 150–400)
RBC: 4.99 MIL/uL (ref 4.22–5.81)
RDW: 15 % (ref 11.5–15.5)
WBC: 11.3 10*3/uL — ABNORMAL HIGH (ref 4.0–10.5)

## 2017-08-23 ENCOUNTER — Encounter (HOSPITAL_COMMUNITY): Payer: Self-pay | Admitting: *Deleted

## 2017-08-23 ENCOUNTER — Encounter (HOSPITAL_COMMUNITY)
Admission: RE | Disposition: A | Discharge status: Home / Self Care | Payer: Self-pay | Source: Ambulatory Visit | Attending: Internal Medicine

## 2017-08-23 ENCOUNTER — Ambulatory Visit (HOSPITAL_COMMUNITY)
Admission: RE | Admit: 2017-08-23 | Discharge: 2017-08-23 | Disposition: A | Discharge status: Home / Self Care | Payer: No Typology Code available for payment source | Source: Ambulatory Visit | Attending: Internal Medicine | Admitting: Internal Medicine

## 2017-08-23 ENCOUNTER — Ambulatory Visit (HOSPITAL_COMMUNITY): Payer: No Typology Code available for payment source | Admitting: Anesthesiology

## 2017-08-23 DIAGNOSIS — K625 Hemorrhage of anus and rectum: Secondary | ICD-10-CM | POA: Insufficient documentation

## 2017-08-23 DIAGNOSIS — Z87891 Personal history of nicotine dependence: Secondary | ICD-10-CM | POA: Insufficient documentation

## 2017-08-23 DIAGNOSIS — K648 Other hemorrhoids: Secondary | ICD-10-CM | POA: Insufficient documentation

## 2017-08-23 DIAGNOSIS — D125 Benign neoplasm of sigmoid colon: Secondary | ICD-10-CM

## 2017-08-23 DIAGNOSIS — K635 Polyp of colon: Secondary | ICD-10-CM | POA: Insufficient documentation

## 2017-08-23 DIAGNOSIS — K5900 Constipation, unspecified: Secondary | ICD-10-CM | POA: Insufficient documentation

## 2017-08-23 DIAGNOSIS — Z79899 Other long term (current) drug therapy: Secondary | ICD-10-CM | POA: Insufficient documentation

## 2017-08-23 DIAGNOSIS — D123 Benign neoplasm of transverse colon: Secondary | ICD-10-CM | POA: Diagnosis not present

## 2017-08-23 DIAGNOSIS — M1990 Unspecified osteoarthritis, unspecified site: Secondary | ICD-10-CM | POA: Insufficient documentation

## 2017-08-23 DIAGNOSIS — K644 Residual hemorrhoidal skin tags: Secondary | ICD-10-CM | POA: Insufficient documentation

## 2017-08-23 DIAGNOSIS — Z885 Allergy status to narcotic agent status: Secondary | ICD-10-CM | POA: Diagnosis not present

## 2017-08-23 DIAGNOSIS — Z7982 Long term (current) use of aspirin: Secondary | ICD-10-CM | POA: Insufficient documentation

## 2017-08-23 DIAGNOSIS — R195 Other fecal abnormalities: Secondary | ICD-10-CM | POA: Insufficient documentation

## 2017-08-23 HISTORY — PX: POLYPECTOMY: SHX5525

## 2017-08-23 HISTORY — PX: COLONOSCOPY WITH PROPOFOL: SHX5780

## 2017-08-23 SURGERY — COLONOSCOPY WITH PROPOFOL
Anesthesia: General

## 2017-08-23 MED ORDER — GLYCOPYRROLATE 0.2 MG/ML IJ SOLN
INTRAMUSCULAR | Status: AC
  Filled 2017-08-23: qty 1

## 2017-08-23 MED ORDER — LACTATED RINGERS IV SOLN
INTRAVENOUS | Status: DC
  Administered 2017-08-23: 09:00:00 via INTRAVENOUS

## 2017-08-23 MED ORDER — CHLORHEXIDINE GLUCONATE CLOTH 2 % EX PADS: 6.0000 | MEDICATED_PAD | Freq: Once | CUTANEOUS | Status: DC

## 2017-08-23 MED ORDER — PROPOFOL 10 MG/ML IV BOLUS
INTRAVENOUS | Status: AC
  Filled 2017-08-23: qty 40

## 2017-08-23 MED ORDER — LIDOCAINE HCL (PF) 1 % IJ SOLN
INTRAMUSCULAR | Status: AC
  Filled 2017-08-23: qty 5

## 2017-08-23 MED ORDER — PROPOFOL 500 MG/50ML IV EMUL
INTRAVENOUS | Status: DC | PRN
  Administered 2017-08-23: 150 ug/kg/min via INTRAVENOUS

## 2017-08-23 MED ORDER — PROPOFOL 10 MG/ML IV BOLUS
INTRAVENOUS | Status: DC | PRN
  Administered 2017-08-23: 40 mg via INTRAVENOUS

## 2017-08-23 MED ORDER — FENTANYL CITRATE (PF) 100 MCG/2ML IJ SOLN: 25.0000 ug | INTRAMUSCULAR | Status: DC | PRN

## 2017-08-23 MED ORDER — PROPOFOL 10 MG/ML IV BOLUS
INTRAVENOUS | Status: AC
  Filled 2017-08-23: qty 60

## 2017-08-23 NOTE — H&P (Signed)
Andre Harvey is an 62 y.o. male.   Chief Complaint: Patient is here for colonoscopy.  HPI: Patient is 61 year old Caucasian male who presents with intermittent rectal bleeding which generally occurs with his bowel movements.  Every now and blood gushes out.  It is always fresh.  He used to be constipated but since he has been eating raisin bran cereal bowels move regularly.  He denies abdominal pain anorexia or weight loss.  He has never been screened for CRC. Last aspirin dose was 2 days ago. Family history is negative for CRC.  Past Medical History:  Diagnosis Date  . Osteoarthritis   . PONV (postoperative nausea and vomiting)    x 1    Past Surgical History:  Procedure Laterality Date  . hip arthroplasty Left   . left hip replacement     2008 Left hip  . SHOULDER SURGERY     left and right  . TOTAL HIP REVISION Left 12/08/2012   Dr Mayer Camel  . TOTAL HIP REVISION Left 12/08/2012   Procedure: TOTAL HIP REVISION WITH HARDWARE REMOVAL ( BALL& CUP);  Surgeon: Kerin Salen, MD;  Location: [facility];  Service: Orthopedics;  Laterality: Left;  . UMBILICAL HERNIA REPAIR      History reviewed. No pertinent family history. Social History:  reports that he has quit smoking. He has never used smokeless tobacco. He reports that he drinks alcohol. He reports that he has current or past drug history. Drug: Marijuana.  Allergies:  Allergies  Allergen Reactions  . Codeine Nausea And Vomiting    Medications Prior to Admission  Medication Sig Dispense Refill  . acyclovir (ZOVIRAX) 400 MG tablet Take 400 mg by mouth daily.     Marland Kitchen aspirin EC 81 MG tablet Take 81 mg by mouth daily.    . diclofenac (VOLTAREN) 75 MG EC tablet Take 75 mg by mouth daily.     Marland Kitchen oxyCODONE-acetaminophen (PERCOCET/ROXICET) 5-325 MG tablet Take 1 tablet by mouth 5 (five) times daily.     . simvastatin (ZOCOR) 20 MG tablet Take 20 mg by mouth 2 (two) times a week.     . naloxone (NARCAN) nasal spray 4 mg/0.1 mL Place 1  spray into the nose once.       No results found for this or any previous visit (from the past 48 hour(s)). No results found.  ROS  Blood pressure 114/77, pulse 60, temperature 97.8 F (36.6 C), temperature source Oral, resp. rate 14, SpO2 96 %. Physical Exam  Constitutional: He appears well-developed and well-nourished.  HENT:  Mouth/Throat: Oropharynx is clear and moist.  Eyes: Conjunctivae are normal.  Neck: No thyromegaly present.  Cardiovascular: Normal rate, regular rhythm and normal heart sounds.  No murmur heard. Respiratory: Effort normal and breath sounds normal.  GI: Soft. He exhibits no distension and no mass. There is no tenderness.  Musculoskeletal: He exhibits no edema.  Lymphadenopathy:    He has no cervical adenopathy.  Skin: Skin is warm and dry.     Assessment/Plan  Rectal bleeding. Diagnostic colonoscopy.  Hildred Laser, MD 08/23/2017, 9:51 AM

## 2017-08-23 NOTE — Discharge Instructions (Signed)
No aspirin or NSAIDs for 24 hours. Resume other medications as before. Resume usual diet. No driving for 24 hours. Physician will call with biopsy results.       Colonoscopy, Adult, Care After This sheet gives you information about how to care for yourself after your procedure. Your doctor may also give you more specific instructions. If you have problems or questions, call your doctor. Follow these instructions at home: General instructions   For the first 24 hours after the procedure: ? Do not drive or use machinery. ? Do not sign important documents. ? Do not drink alcohol. ? Do your daily activities more slowly than normal. ? Eat foods that are soft and easy to digest. ? Rest often.  Take over-the-counter or prescription medicines only as told by your doctor.  It is up to you to get the results of your procedure. Ask your doctor, or the department performing the procedure, when your results will be ready. To help cramping and bloating:  Try walking around.  Put heat on your belly (abdomen) as told by your doctor. Use a heat source that your doctor recommends, such as a moist heat pack or a heating pad. ? Put a towel between your skin and the heat source. ? Leave the heat on for 20-30 minutes. ? Remove the heat if your skin turns bright red. This is especially important if you cannot feel pain, heat, or cold. You can get burned. Eating and drinking  Drink enough fluid to keep your pee (urine) clear or pale yellow.  Return to your normal diet as told by your doctor. Avoid heavy or fried foods that are hard to digest.  Avoid drinking alcohol for as long as told by your doctor. Contact a doctor if:  You have blood in your poop (stool) 2-3 days after the procedure. Get help right away if:  You have more than a small amount of blood in your poop.  You see large clumps of tissue (blood clots) in your poop.  Your belly is swollen.  You feel sick to your stomach  (nauseous).  You throw up (vomit).  You have a fever.  You have belly pain that gets worse, and medicine does not help your pain. This information is not intended to replace advice given to you by your health care provider. Make sure you discuss any questions you have with your health care provider. Document Released: 02/03/2010 Document Revised: 09/26/2015 Document Reviewed: 09/26/2015 Elsevier Interactive Patient Education  2017 [place].     Colon Polyps Polyps are tissue growths inside the body. Polyps can grow in many places, including the large intestine (colon). A polyp may be a round bump or a mushroom-shaped growth. You could have one polyp or several. Most colon polyps are noncancerous (benign). However, some colon polyps can become cancerous over time. What are the causes? The exact cause of colon polyps is not known. What increases the risk? This condition is more likely to develop in people who:  Have a family history of colon cancer or colon polyps.  Are older than 3 or older than 45 if they are African American.  Have inflammatory bowel disease, such as ulcerative colitis or Crohn disease.  Are overweight.  Smoke cigarettes.  Do not get enough exercise.  Drink too much alcohol.  Eat a diet that is: ? High in fat and red meat. ? Low in fiber.  Had childhood cancer that was treated with abdominal radiation.  What are the signs  or symptoms? Most polyps do not cause symptoms. If you have symptoms, they may include:  Blood coming from your rectum when having a bowel movement.  Blood in your stool.The stool may look dark red or black.  A change in bowel habits, such as constipation or diarrhea.  How is this diagnosed? This condition is diagnosed with a colonoscopy. This is a procedure that uses a lighted, flexible scope to look at the inside of your colon. How is this treated? Treatment for this condition involves removing any polyps that are  found. Those polyps will then be tested for cancer. If cancer is found, your health care provider will talk to you about options for colon cancer treatment. Follow these instructions at home: Diet  Eat plenty of fiber, such as fruits, vegetables, and whole grains.  Eat foods that are high in calcium and vitamin D, such as milk, cheese, yogurt, eggs, liver, fish, and broccoli.  Limit foods high in fat, red meats, and processed meats, such as hot dogs, sausage, bacon, and lunch meats.  Maintain a healthy weight, or lose weight if recommended by your health care provider. General instructions  Do not smoke cigarettes.  Do not drink alcohol excessively.  Keep all follow-up visits as told by your health care provider. This is important. This includes keeping regularly scheduled colonoscopies. Talk to your health care provider about when you need a colonoscopy.  Exercise every day or as told by your health care provider. Contact a health care provider if:  You have new or worsening bleeding during a bowel movement.  You have new or increased blood in your stool.  You have a change in bowel habits.  You unexpectedly lose weight. This information is not intended to replace advice given to you by your health care provider. Make sure you discuss any questions you have with your health care provider. Document Released: 09/28/2003 Document Revised: 06/09/2015 Document Reviewed: 11/22/2014 Elsevier Interactive Patient Education  2018 [employer].    Hemorrhoids Hemorrhoids are swollen veins in and around the rectum or anus. There are two types of hemorrhoids:  Internal hemorrhoids. These occur in the veins that are just inside the rectum. They may poke through to the outside and become irritated and painful.  External hemorrhoids. These occur in the veins that are outside of the anus and can be felt as a painful swelling or hard lump near the anus.  Most hemorrhoids do not cause  serious problems, and they can be managed with home treatments such as diet and lifestyle changes. If home treatments do not help your symptoms, procedures can be done to shrink or remove the hemorrhoids. What are the causes? This condition is caused by increased pressure in the anal area. This pressure may result from various things, including:  Constipation.  Straining to have a bowel movement.  Diarrhea.  Pregnancy.  Obesity.  Sitting for long periods of time.  Heavy lifting or other activity that causes you to strain.  Anal sex.  What are the signs or symptoms? Symptoms of this condition include:  Pain.  Anal itching or irritation.  Rectal bleeding.  Leakage of stool (feces).  Anal swelling.  One or more lumps around the anus.  How is this diagnosed? This condition can often be diagnosed through a visual exam. Other exams or tests may also be done, such as:  Examination of the rectal area with a gloved hand (digital rectal exam).  Examination of the anal canal using a small tube (  anoscope).  A blood test, if you have lost a significant amount of blood.  A test to look inside the colon (sigmoidoscopy or colonoscopy).  How is this treated? This condition can usually be treated at home. However, various procedures may be done if dietary changes, lifestyle changes, and other home treatments do not help your symptoms. These procedures can help make the hemorrhoids smaller or remove them completely. Some of these procedures involve surgery, and others do not. Common procedures include:  Rubber band ligation. Rubber bands are placed at the base of the hemorrhoids to cut off the blood supply to them.  Sclerotherapy. Medicine is injected into the hemorrhoids to shrink them.  Infrared coagulation. A type of light energy is used to get rid of the hemorrhoids.  Hemorrhoidectomy surgery. The hemorrhoids are surgically removed, and the veins that supply them are tied  off.  Stapled hemorrhoidopexy surgery. A circular stapling device is used to remove the hemorrhoids and use staples to cut off the blood supply to them.  Follow these instructions at home: Eating and drinking  Eat foods that have a lot of fiber in them, such as whole grains, beans, nuts, fruits, and vegetables. Ask your health care provider about taking products that have added fiber (fiber supplements).  Drink enough fluid to keep your urine clear or pale yellow. Managing pain and swelling  Take warm sitz baths for 20 minutes, 3-4 times a day to ease pain and discomfort.  If directed, apply ice to the affected area. Using ice packs between sitz baths may be helpful. ? Put ice in a plastic bag. ? Place a towel between your skin and the bag. ? Leave the ice on for 20 minutes, 2-3 times a day. General instructions  Take over-the-counter and prescription medicines only as told by your health care provider.  Use medicated creams or suppositories as told.  Exercise regularly.  Go to the bathroom when you have the urge to have a bowel movement. Do not wait.  Avoid straining to have bowel movements.  Keep the anal area dry and clean. Use wet toilet paper or moist towelettes after a bowel movement.  Do not sit on the toilet for long periods of time. This increases blood pooling and pain. Contact a health care provider if:  You have increasing pain and swelling that are not controlled by treatment or medicine.  You have uncontrolled bleeding.  You have difficulty having a bowel movement, or you are unable to have a bowel movement.  You have pain or inflammation outside the area of the hemorrhoids. This information is not intended to replace advice given to you by your health care provider. Make sure you discuss any questions you have with your health care provider. Document Released: 12/30/1999 Document Revised: 06/01/2015 Document Reviewed: 09/15/2014 Elsevier Interactive Patient  Education  2018 [place], Care After These instructions provide you with information about caring for yourself after your procedure. Your health care provider may also give you more specific instructions. Your treatment has been planned according to current medical practices, but problems sometimes occur. Call your health care provider if you have any problems or questions after your procedure. What can I expect after the procedure? After your procedure, it is common to:  Feel sleepy for several hours.  Feel clumsy and have poor balance for several hours.  Feel forgetful about what happened after the procedure.  Have poor judgment for several hours.  Feel nauseous  or vomit.  Have a sore throat if you had a breathing tube during the procedure.  Follow these instructions at home: For at least 24 hours after the procedure:   Do not: ? Participate in activities in which you could fall or become injured. ? Drive. ? Use heavy machinery. ? Drink alcohol. ? Take sleeping pills or medicines that cause drowsiness. ? Make important decisions or sign legal documents. ? Take care of children on your own.  Rest. Eating and drinking  Follow the diet that is recommended by your health care provider.  If you vomit, drink water, juice, or soup when you can drink without vomiting.  Make sure you have little or no nausea before eating solid foods. General instructions  Have a responsible adult stay with you until you are awake and alert.  Take over-the-counter and prescription medicines only as told by your health care provider.  If you smoke, do not smoke without supervision.  Keep all follow-up visits as told by your health care provider. This is important. Contact a health care provider if:  You keep feeling nauseous or you keep vomiting.  You feel light-headed.  You develop a rash.  You have a fever. Get help right away if:  You have  trouble breathing. This information is not intended to replace advice given to you by your health care provider. Make sure you discuss any questions you have with your health care provider. Document Released: 04/24/2015 Document Revised: 08/24/2015 Document Reviewed: 04/24/2015 Elsevier Interactive Patient Education  [employer].

## 2017-08-23 NOTE — Anesthesia Postprocedure Evaluation (Signed)
Anesthesia Post Note  Patient: Andre Harvey  Procedure(s) Performed: COLONOSCOPY WITH PROPOFOL (N/A ) POLYPECTOMY  Patient location during evaluation: PACU Anesthesia Type: General Level of consciousness: awake and alert and oriented Pain management: pain level controlled Vital Signs Assessment: post-procedure vital signs reviewed and stable Respiratory status: spontaneous breathing Cardiovascular status: stable Postop Assessment: no apparent nausea or vomiting Anesthetic complications: no     Last Vitals:  Vitals:   08/23/17 0900 08/23/17 1058  BP: 114/77 113/65  Pulse:  (!) 59  Resp: 14 17  Temp:  36.8 C  SpO2: 96% 96%    Last Pain:  Vitals:   08/23/17 1025  TempSrc:   PainSc: 0-No pain                 ADAMS, AMY A

## 2017-08-23 NOTE — Anesthesia Procedure Notes (Signed)
Procedure Name: MAC Date/Time: 08/23/2017 10:22 AM Performed by: Andree Elk Amy A, CRNA Pre-anesthesia Checklist: Patient identified, Patient being monitored, Suction available, Emergency Drugs available and Timeout performed Patient Re-evaluated:Patient Re-evaluated prior to induction Oxygen Delivery Method: Simple face mask

## 2017-08-23 NOTE — Anesthesia Preprocedure Evaluation (Signed)
Anesthesia Evaluation  Patient identified by MRN, date of birth, ID band Patient awake  General Assessment Comment:Pt report drinking water at 8am -evasive with the amount  Will wait until 10am to start  Denies any other food or drink after midnight   Reviewed: Allergy & Precautions, NPO status , Patient's Chart, lab work & pertinent test results  History of Anesthesia Complications (+) PONV  Airway Mallampati: I  TM Distance: >3 FB Neck ROM: Full    Dental no notable dental hx. (+) Teeth Intact States one temp crown in place on Lower L molar :   Pulmonary neg pulmonary ROS, former smoker,  States having current sinus issues  Admits to daily marijuana   Pulmonary exam normal breath sounds clear to auscultation       Cardiovascular Exercise Tolerance: Good negative cardio ROS Normal cardiovascular examI Rhythm:Regular Rate:Normal     Neuro/Psych negative neurological ROS  negative psych ROS   GI/Hepatic negative GI ROS, Neg liver ROS,   Endo/Other  negative endocrine ROS  Renal/GU negative Renal ROS  negative genitourinary   Musculoskeletal negative musculoskeletal ROS (+) Arthritis , Osteoarthritis,    Abdominal   Peds negative pediatric ROS (+)  Hematology negative hematology ROS (+)   Anesthesia Other Findings   Reproductive/Obstetrics negative OB ROS                             Anesthesia Physical Anesthesia Plan  ASA: II  Anesthesia Plan: General   Post-op Pain Management:    Induction: Intravenous  PONV Risk Score and Plan:   Airway Management Planned: Nasal Cannula  Additional Equipment:   Intra-op Plan:   Post-operative Plan:   Informed Consent: I have reviewed the patients History and Physical, chart, labs and discussed the procedure including the risks, benefits and alternatives for the proposed anesthesia with the patient or authorized representative who has  indicated his/her understanding and acceptance.   Dental advisory given  Plan Discussed with: CRNA  Anesthesia Plan Comments:         Anesthesia Quick Evaluation

## 2017-08-23 NOTE — Transfer of Care (Signed)
Immediate Anesthesia Transfer of Care Note  Patient: Andre Harvey  Procedure(s) Performed: COLONOSCOPY WITH PROPOFOL (N/A ) POLYPECTOMY  Patient Location: PACU  Anesthesia Type:MAC  Level of Consciousness: awake, alert , oriented and patient cooperative  Airway & Oxygen Therapy: Patient Spontanous Breathing and Patient connected to nasal cannula oxygen  Post-op Assessment: Report given to RN and Post -op Vital signs reviewed and stable  Post vital signs: Reviewed and stable  Last Vitals:  Vitals Value Taken Time  BP 113/65 08/23/2017 11:00 AM  Temp    Pulse 61 08/23/2017 11:00 AM  Resp 13 08/23/2017 11:00 AM  SpO2 97 % 08/23/2017 11:00 AM  Vitals shown include unvalidated device data.  Last Pain:  Vitals:   08/23/17 1025  TempSrc:   PainSc: 0-No pain         Complications: No apparent anesthesia complications

## 2017-08-23 NOTE — Op Note (Signed)
[facility] Patient Name: Andre Harvey Procedure Date: 08/23/2017 10:15 AM MRN: [id number] Date of Birth: [birth year] Attending MD: Hildred Laser , MD CSN: [id number] Age: 62 Admit Type: Outpatient Procedure:                Colonoscopy Indications:              Rectal bleeding Providers:                Hildred Laser, MD, Jeanann Lewandowsky. Sharon Seller, RN, Randa Spike, Technician Referring MD:             Sena Slate, NP Medicines:                Propofol per Anesthesia Complications:            No immediate complications. Estimated Blood Loss:     Estimated blood loss was minimal. Procedure:                Pre-Anesthesia Assessment:                           - Prior to the procedure, a History and Physical                            was performed, and patient medications and                            allergies were reviewed. The patient's tolerance of                            previous anesthesia was also reviewed. The risks                            and benefits of the procedure and the sedation                            options and risks were discussed with the patient.                            All questions were answered, and informed consent                            was obtained. Prior Anticoagulants: The patient                            last took aspirin 2 days and previous NSAID                            medication 1 day prior to the procedure. ASA Grade                            Assessment: II - A patient with mild systemic  disease. After reviewing the risks and benefits,                            the patient was deemed in satisfactory condition to                            undergo the procedure.                           After obtaining informed consent, the colonoscope                            was passed under direct vision. Throughout the                            procedure, the patient's blood pressure,  pulse, and                            oxygen saturations were monitored continuously. The                            [id number]-H190DL ([id number]) scope was introduced through                            the and advanced to the the cecum, identified by                            appendiceal orifice and ileocecal valve. The                            colonoscopy was performed without difficulty. The                            patient tolerated the procedure well. The quality                            of the bowel preparation was adequate. The                            ileocecal valve, appendiceal orifice, and rectum                            were photographed. Scope In: 10:31:11 AM Scope Out: 10:50:34 AM Scope Withdrawal Time: 0 hours 13 minutes 10 seconds  Total Procedure Duration: 0 hours 19 minutes 23 seconds  Findings:      The perianal and digital rectal examinations were normal.      A small polyp was found in the splenic flexure. The polyp was sessile.       The polyp was removed with a cold snare. Resection was complete, but the       polyp tissue was not retrieved.      A small polyp was found in the distal sigmoid colon. The polyp was       sessile. The polyp was removed with a cold snare. Resection and  retrieval were complete. The pathology specimen was placed into Bottle       Number 1.      External and internal hemorrhoids were found during retroflexion. The       hemorrhoids were small. Impression:               - One small polyp at the splenic flexure, removed                            with a cold snare. Complete resection. Polyp tissue                            not retrieved.                           - One small polyp in the distal sigmoid colon,                            removed with a cold snare. Resected and retrieved.                           - External and internal hemorrhoids. Moderate Sedation:      Per Anesthesia Care Recommendation:           - Patient  has a contact number available for                            emergencies. The signs and symptoms of potential                            delayed complications were discussed with the                            patient. Return to normal activities tomorrow.                            Written discharge instructions were provided to the                            patient.                           - Resume previous diet today.                           - Continue present medications.                           - Await pathology results.                           - Repeat colonoscopy is recommended. The                            colonoscopy date will be determined after pathology  results from today's exam become available for                            review. Procedure Code(s):        --- Professional ---                           [phone], Colonoscopy, flexible; with removal of                            tumor(s), polyp(s), or other lesion(s) by snare                            technique Diagnosis Code(s):        --- Professional ---                           D12.3, Benign neoplasm of transverse colon (hepatic                            flexure or splenic flexure)                           D12.5, Benign neoplasm of sigmoid colon                           K64.8, Other hemorrhoids                           K62.5, Hemorrhage of anus and rectum CPT copyright 2017 American Medical [employer]. All rights reserved. The codes documented in this report are preliminary and upon coder review may  be revised to meet current compliance requirements. Hildred Laser, MD Hildred Laser, MD 08/23/2017 11:01:05 AM This report has been signed electronically. Number of Addenda: 0

## 2017-08-28 ENCOUNTER — Encounter (HOSPITAL_COMMUNITY): Payer: Self-pay | Admitting: Internal Medicine

## 2018-04-17 ENCOUNTER — Encounter (HOSPITAL_COMMUNITY): Payer: Self-pay

## 2018-04-17 ENCOUNTER — Emergency Department (HOSPITAL_COMMUNITY): Payer: No Typology Code available for payment source

## 2018-04-17 ENCOUNTER — Other Ambulatory Visit: Payer: Self-pay

## 2018-04-17 ENCOUNTER — Emergency Department (HOSPITAL_COMMUNITY)
Admission: EM | Admit: 2018-04-17 | Discharge: 2018-04-17 | Disposition: A | Discharge status: Home / Self Care | Payer: No Typology Code available for payment source | Attending: Emergency Medicine | Admitting: Emergency Medicine

## 2018-04-17 DIAGNOSIS — Z96642 Presence of left artificial hip joint: Secondary | ICD-10-CM | POA: Insufficient documentation

## 2018-04-17 DIAGNOSIS — Z87891 Personal history of nicotine dependence: Secondary | ICD-10-CM | POA: Diagnosis not present

## 2018-04-17 DIAGNOSIS — I2510 Atherosclerotic heart disease of native coronary artery without angina pectoris: Secondary | ICD-10-CM | POA: Diagnosis not present

## 2018-04-17 DIAGNOSIS — N23 Unspecified renal colic: Secondary | ICD-10-CM | POA: Insufficient documentation

## 2018-04-17 DIAGNOSIS — Z7982 Long term (current) use of aspirin: Secondary | ICD-10-CM | POA: Insufficient documentation

## 2018-04-17 DIAGNOSIS — Z79899 Other long term (current) drug therapy: Secondary | ICD-10-CM | POA: Insufficient documentation

## 2018-04-17 DIAGNOSIS — R3129 Other microscopic hematuria: Secondary | ICD-10-CM | POA: Insufficient documentation

## 2018-04-17 DIAGNOSIS — R1032 Left lower quadrant pain: Secondary | ICD-10-CM | POA: Diagnosis present

## 2018-04-17 LAB — URINALYSIS, ROUTINE W REFLEX MICROSCOPIC
Bacteria, UA: NONE SEEN
Bilirubin Urine: NEGATIVE
Glucose, UA: NEGATIVE mg/dL
Hgb urine dipstick: NEGATIVE
Ketones, ur: 5 mg/dL — AB
Leukocytes,Ua: NEGATIVE
Nitrite: NEGATIVE
Protein, ur: 30 mg/dL — AB
Specific Gravity, Urine: 1.02 (ref 1.005–1.030)
pH: 5 (ref 5.0–8.0)

## 2018-04-17 MED ORDER — LORAZEPAM 2 MG/ML IJ SOLN
1.0000 mg | Freq: Once | INTRAMUSCULAR | Status: AC
  Administered 2018-04-17: 1 mg via INTRAVENOUS
  Filled 2018-04-17: qty 1

## 2018-04-17 MED ORDER — HYDROMORPHONE HCL 1 MG/ML IJ SOLN
1.0000 mg | Freq: Once | INTRAMUSCULAR | Status: AC
  Administered 2018-04-17: 1 mg via INTRAVENOUS
  Filled 2018-04-17: qty 1

## 2018-04-17 NOTE — ED Notes (Signed)
Upon going into pts room to see of pt was able to provide urine. Pt states he was unable to provide UA. pts urinal was on the floor. Pts urinal placed back at bedside within pt reach. Pt verbalizes understanding for need of Urine sample. Pt stated "The pain is creeping back up"

## 2018-04-17 NOTE — Discharge Instructions (Addendum)
Your CAT scan showed some disease in your heart arteries.  Follow-up with your doctor for this.

## 2018-04-17 NOTE — ED Provider Notes (Signed)
[facility] DEPT Provider Note   CSN: [id number] Arrival date & time: 04/17/18  5462    History   Chief Complaint No chief complaint on file.   HPI Andre Harvey is a 63 y.o. male.     63 year old male who presents with sudden onset of left-sided flank pain consistent with his prior renal colic.  He denies any dysuria or hematuria.  Pain is been colicky in nature.  No pain or swelling to his scrotum.  Took 15 mg of oxycodone at home without relief.  Called EMS and was given 200 mcg of fentanyl and transported here.     Past Medical History:  Diagnosis Date  . Osteoarthritis   . PONV (postoperative nausea and vomiting)    x 1    Patient Active Problem List   Diagnosis Date Noted  . Rectal bleeding 07/31/2017  . Heme positive stool 07/31/2017  . 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 COLONOSCOPY WITH PROPOFOL N/A 08/23/2017   Procedure: COLONOSCOPY WITH PROPOFOL;  Surgeon: Rogene Houston, MD;  Location: [facility];  Service: Endoscopy;  Laterality: N/A;  10:50-moved to 950 per Ann  . hip arthroplasty Left   . left hip replacement     2008 Left hip  . POLYPECTOMY  08/23/2017   Procedure: POLYPECTOMY;  Surgeon: Rogene Houston, MD;  Location: [facility];  Service: Endoscopy;;  colon  . SHOULDER SURGERY     left and right  . TOTAL HIP REVISION Left 12/08/2012   Dr Mayer Camel  . TOTAL HIP REVISION Left 12/08/2012   Procedure: TOTAL HIP REVISION WITH HARDWARE REMOVAL ( BALL& CUP);  Surgeon: Kerin Salen, MD;  Location: [facility];  Service: Orthopedics;  Laterality: Left;  . UMBILICAL HERNIA REPAIR          Home Medications    Prior to Admission medications   Medication Sig Start Date End Date Taking? Authorizing Provider  acyclovir (ZOVIRAX) 400 MG tablet Take 400 mg by mouth daily.     [provider]  aspirin EC 81 MG tablet Take 1 tablet (81  mg total) by mouth daily. 08/24/17   Rogene Houston, MD  diclofenac (VOLTAREN) 75 MG EC tablet Take 1 tablet (75 mg total) by mouth daily. 08/24/17   Rogene Houston, MD  naloxone [facility]) nasal spray 4 mg/0.1 mL Place 1 spray into the nose once.     [provider]  oxyCODONE-acetaminophen (PERCOCET/ROXICET) 5-325 MG tablet Take 1 tablet by mouth 5 (five) times daily.     [provider]  simvastatin (ZOCOR) 20 MG tablet Take 20 mg by mouth 2 (two) times a week.     [provider]    Family History No family history on file.  Social History Social History   Tobacco Use  . Smoking status: Former Research scientist (life sciences)  . Smokeless tobacco: Never Used  . Tobacco comment: quit a couple of years ago  Substance Use Topics  . Alcohol use: Yes    Comment: reports drinks about every 2- 3 months reports used to be heavy  . Drug use: Yes    Types: Marijuana    Comment: small amt. daily     Allergies   Codeine   Review of Systems Review of Systems  All other systems reviewed and are negative.    Physical Exam Updated Vital Signs There were no vitals  taken for this visit.  Physical Exam Vitals signs and nursing note reviewed.  Constitutional:      General: He is not in acute distress.    Appearance: Normal appearance. He is well-developed. He is not toxic-appearing.  HENT:     Head: Normocephalic and atraumatic.  Eyes:     General: Lids are normal.     Conjunctiva/sclera: Conjunctivae normal.     Pupils: Pupils are equal, round, and reactive to light.  Neck:     Musculoskeletal: Normal range of motion and neck supple.     Thyroid: No thyroid mass.     Trachea: No tracheal deviation.  Cardiovascular:     Rate and Rhythm: Normal rate and regular rhythm.     Heart sounds: Normal heart sounds. No murmur. No gallop.   Pulmonary:     Effort: Pulmonary effort is normal. No respiratory distress.     Breath sounds: Normal breath sounds. No stridor. No decreased  breath sounds, wheezing, rhonchi or rales.  Abdominal:     General: Bowel sounds are normal. There is no distension.     Palpations: Abdomen is soft.     Tenderness: There is no abdominal tenderness. There is no rebound.  Musculoskeletal: Normal range of motion.        General: No tenderness.  Skin:    General: Skin is warm and dry.     Findings: No abrasion or rash.  Neurological:     Mental Status: He is alert and oriented to person, place, and time.     GCS: GCS eye subscore is 4. GCS verbal subscore is 5. GCS motor subscore is 6.     Cranial Nerves: No cranial nerve deficit.     Sensory: No sensory deficit.  Psychiatric:        Speech: Speech normal.        Behavior: Behavior normal.      ED Treatments / Results  Labs (all labs ordered are listed, but only abnormal results are displayed) Labs Reviewed  URINALYSIS, ROUTINE W REFLEX MICROSCOPIC    EKG None  Radiology No results found.  Procedures Procedures (including critical care time)  Medications Ordered in ED Medications  LORazepam (ATIVAN) injection 1 mg (has no administration in time range)  HYDROmorphone (DILAUDID) injection 1 mg (has no administration in time range)     Initial Impression / Assessment and Plan / ED Course  I have reviewed the triage vital signs and the nursing notes.  Pertinent labs & imaging results that were available during my care of the patient were reviewed by me and considered in my medical decision making (see chart for details).        Patient medicated for pain and feels better here.  Urinalysis shows hematuria.  CT scan shows left-sided hydro-with visualized stone.  Patient already has narcotics at home and was instructed to continue that and will be given urological referral.  Patient's CT results noted sometimes a coronary atherosclerosis and he has had no pain chest pain or chest pressure.  Instructed to follow-up with his doctor for this.  Final Clinical Impressions(s)  / ED Diagnoses   Final diagnoses:  None    ED Discharge Orders    None       Lacretia Leigh, MD 04/17/18 1427

## 2018-04-17 NOTE — ED Notes (Signed)
Bed: OE70 Expected date:  Expected time:  Means of arrival:  Comments: EMS 63yo flank pain

## 2018-04-17 NOTE — ED Notes (Signed)
Pt placed on [place] at 2lpm due to decreased O2 sat after pain medication

## 2018-04-17 NOTE — ED Triage Notes (Signed)
Pt c/o flank pain and painful/difficulty urinating, has a hx of kidney stones.

## 2020-03-15 IMAGING — CT CT RENAL STONE PROTOCOL
2 of 4 series · 15 of 46 positions shown, 17 images · non-contrast
Comparison: CT of the abdomen and pelvis with contrast on
05/04/2007

CLINICAL DATA: Flank pain and difficulty urinating. History of
renal calculi.

EXAM:
CT ABDOMEN AND PELVIS WITHOUT CONTRAST
TECHNIQUE: Multidetector CT imaging of the abdomen and pelvis was performed
following the standard protocol without IV contrast.

[Series 2: axial st · axial · 0.78mm/px · z∈[-506,-106]mm · 12 of 92 slices shown, 14 images]
[im 6/92  soft-tissue]
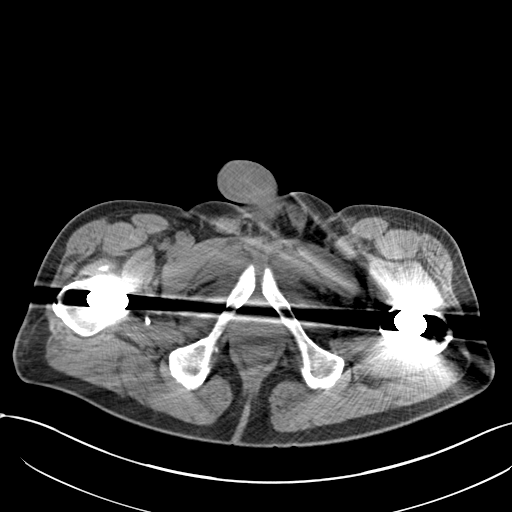
[im 6/92  bone]
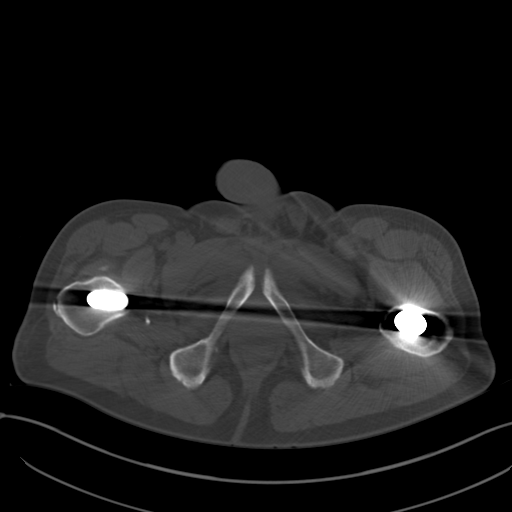
[im 18/92  soft-tissue]
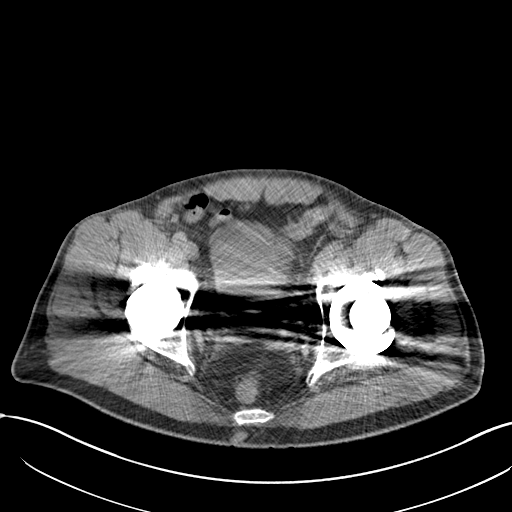
[im 23/92  soft-tissue]
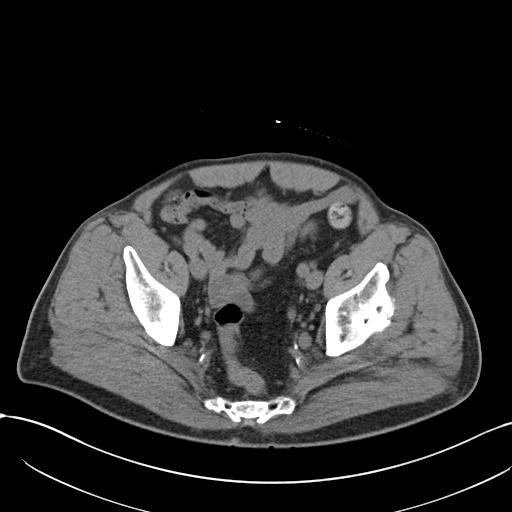
[im 35/92  soft-tissue]
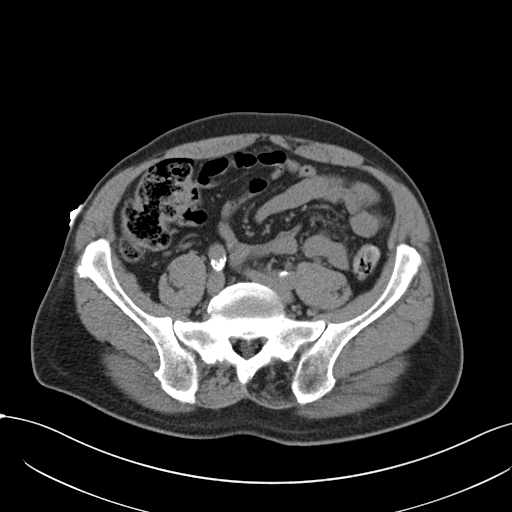
[im 40/92  soft-tissue]
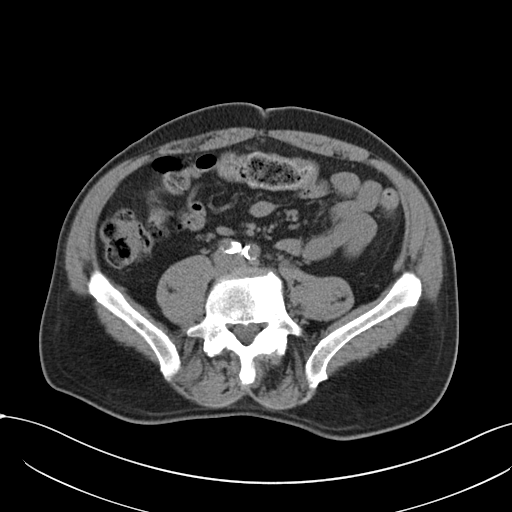
[im 46/92  soft-tissue]
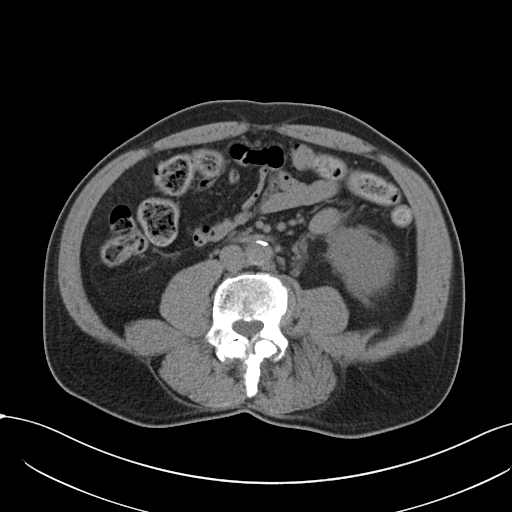
[im 52/92  soft-tissue]
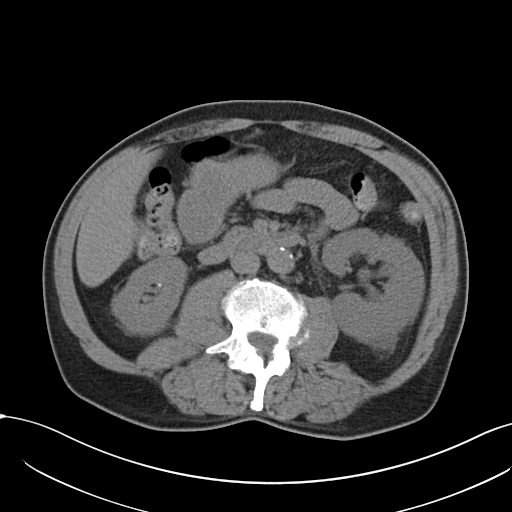
[im 57/92  soft-tissue]
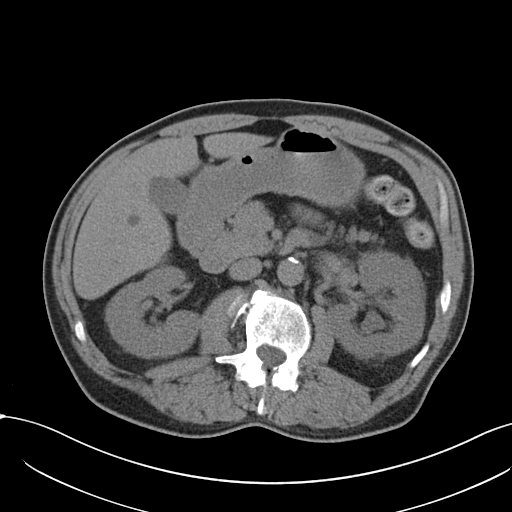
[im 63/92  soft-tissue]
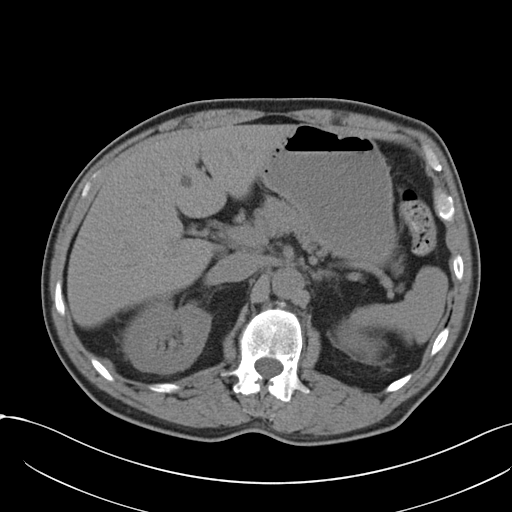
[im 63/92  bone]
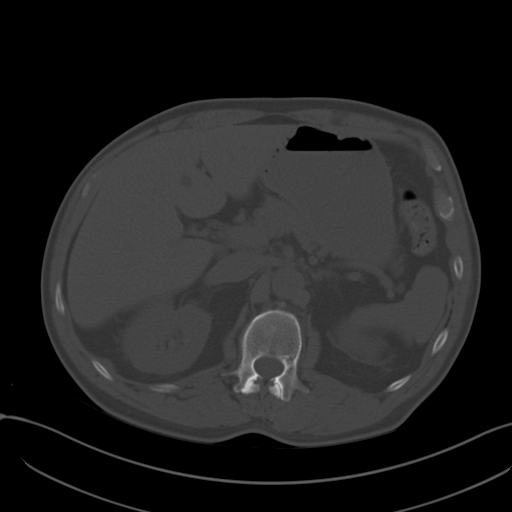
[im 74/92  soft-tissue]
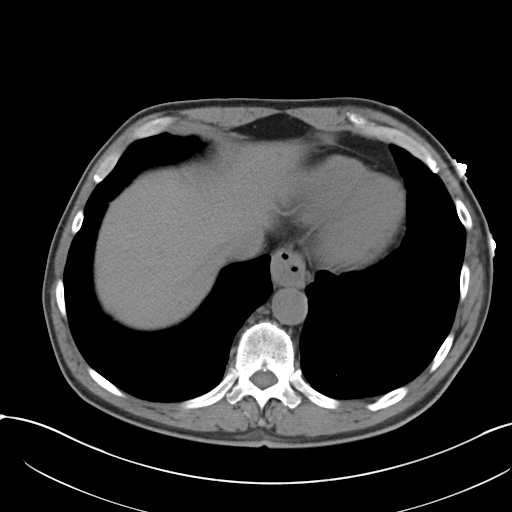
[im 80/92  soft-tissue]
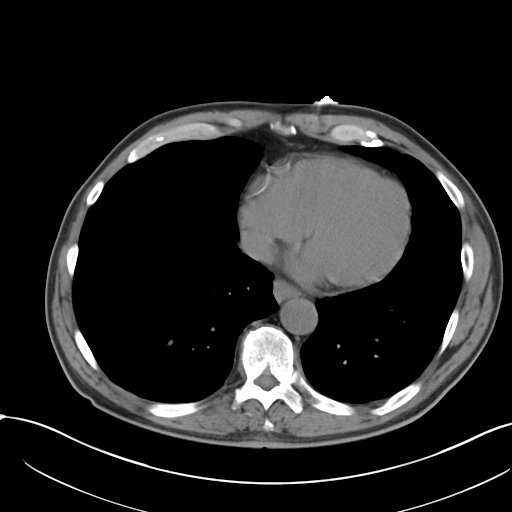
[im 86/92  soft-tissue]
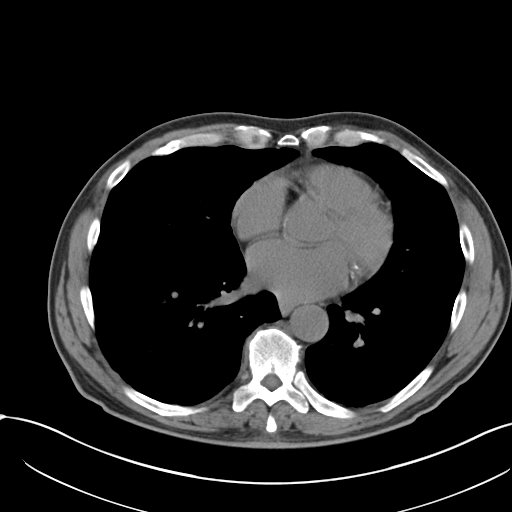

[Series 5: coronal · coronal · 0.74mm/px · 3 of 151 slices shown]
[im 51/151  soft-tissue]
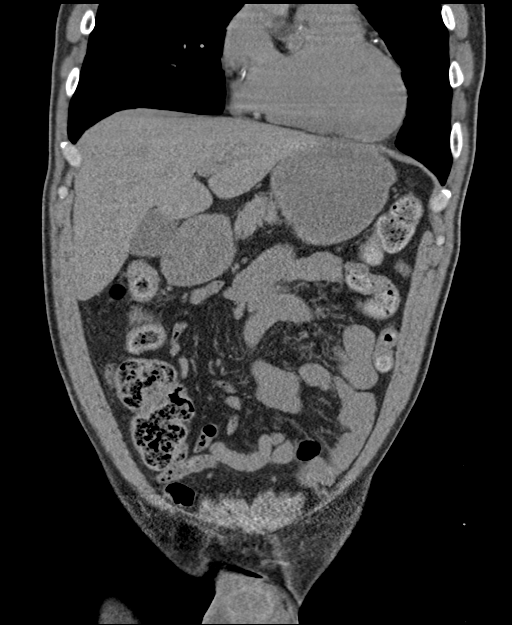
[im 67/151  soft-tissue]
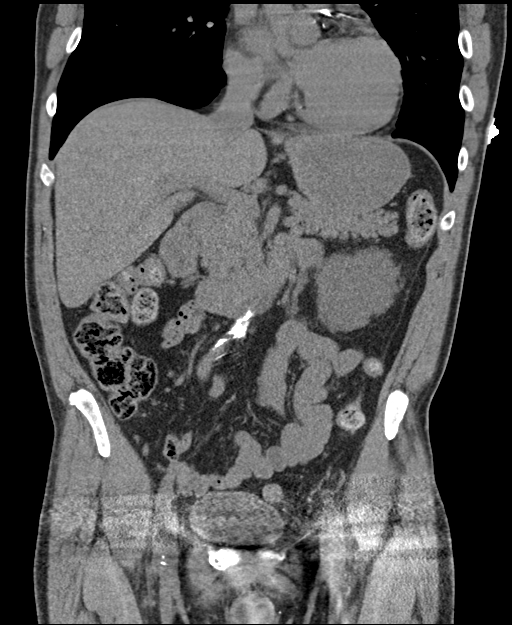
[im 84/151  soft-tissue]
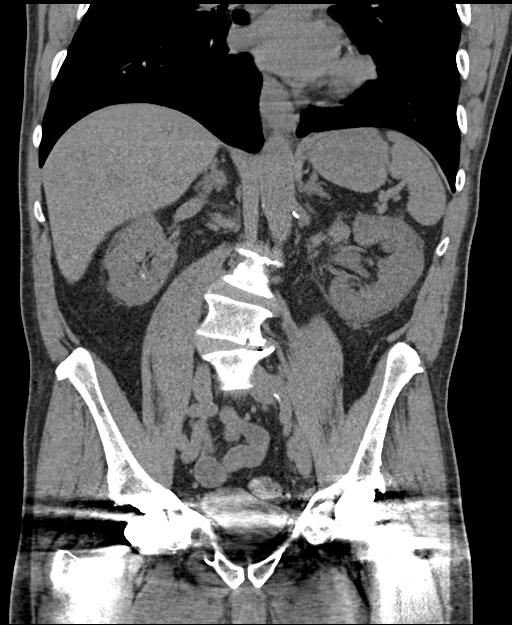

[15 of 46 positions shown; findings below may reference images not displayed]

FINDINGS: Lower chest: Visualized base of the heart demonstrates heavily
calcified coronary artery plaque in a 3 vessel distribution. The
heart size appears normal. There is a small hiatal hernia. Lung
bases show no acute findings or significant incidental findings.

Hepatobiliary: Small hepatic cysts again noted. One cyst in the left
lobe shows slight enlargement since 6222 but appears benign. No
gallstones, gallbladder wall thickening, or biliary dilatation.

Pancreas: Unremarkable. No pancreatic ductal dilatation or
surrounding inflammatory changes.

Spleen: Normal in size without focal abnormality.

Adrenals/Urinary Tract: Adrenal glands are unremarkable. There is
evidence of mild left-sided hydronephrosis, left renal edema and
some perinephric stranding. No intrarenal calculi identified on the
left. As the ureter is followed inferiorly, the distal ureter can
not be well evaluated due to streak artifact from bilateral hip
arthroplasties. It is suspected that there may be a distal ureteral
calculus on the left given the renal findings.

The right kidney demonstrates at least 2 punctate intrarenal calculi
that are nonobstructing. The bladder is largely obscured by streak
artifact except for the anterior portion which appears unremarkable.

Stomach/Bowel: Bowel shows no evidence of obstruction, ileus,
inflammation or perforation. No lesions identified.

Vascular/Lymphatic: Calcified plaque in the abdominal aorta and
iliac arteries without evidence of aneurysm. No enlarged lymph nodes
identified.

Other: No abdominal wall hernia or abnormality. No abdominopelvic
ascites.

Musculoskeletal: The lumbar spine demonstrates degenerative disc
disease. There is an associated leftward convex scoliosis.
IMPRESSION: 1. Mild left-sided hydronephrosis with left renal edema and
perinephric stranding. It is suspected that there may be a distal
left ureteral calculus but the distal left ureter and ureterovesical
junction can not be evaluated due to streak artifact from bilateral
hip prostheses. Some punctate nonobstructing right renal calculi are
present.
2. Incidental detection of coronary atherosclerosis with heavily
calcified plaque in a 3 vessel distribution.

## 2023-05-24 ENCOUNTER — Other Ambulatory Visit (HOSPITAL_COMMUNITY): Payer: Self-pay

## 2023-05-24 DIAGNOSIS — N2889 Other specified disorders of kidney and ureter: Secondary | ICD-10-CM

## 2023-05-31 ENCOUNTER — Other Ambulatory Visit: Payer: Self-pay | Admitting: Physician Assistant

## 2023-05-31 DIAGNOSIS — N2889 Other specified disorders of kidney and ureter: Secondary | ICD-10-CM
